# Patient Record
Sex: Female | Born: 1970 | Race: White | Hispanic: No | Marital: Married | Smoking: Former smoker
Health system: Southern US, Community
[De-identification: ages and names within clinical notes are randomized; demographics above are authoritative.]

## PROBLEM LIST (undated history)

## (undated) DIAGNOSIS — F419 Anxiety disorder, unspecified: Secondary | ICD-10-CM

## (undated) DIAGNOSIS — G43909 Migraine, unspecified, not intractable, without status migrainosus: Secondary | ICD-10-CM

## (undated) DIAGNOSIS — T7840XA Allergy, unspecified, initial encounter: Secondary | ICD-10-CM

## (undated) DIAGNOSIS — F32A Depression, unspecified: Secondary | ICD-10-CM

## (undated) HISTORY — PX: TUBAL LIGATION: SHX77

## (undated) HISTORY — DX: Allergy, unspecified, initial encounter: T78.40XA

## (undated) HISTORY — DX: Depression, unspecified: F32.A

## (undated) HISTORY — DX: Anxiety disorder, unspecified: F41.9

## (undated) HISTORY — PX: TOE SURGERY: SHX1073

## (undated) HISTORY — DX: Migraine, unspecified, not intractable, without status migrainosus: G43.909

---

## 2011-02-16 HISTORY — PX: ABLATION ON ENDOMETRIOSIS: SHX5787

## 2015-02-16 HISTORY — PX: CARDIAC ELECTROPHYSIOLOGY MAPPING AND ABLATION: SHX1292

## 2021-05-04 ENCOUNTER — Encounter: Payer: Self-pay | Admitting: Family Medicine

## 2021-05-04 ENCOUNTER — Ambulatory Visit: Payer: No Typology Code available for payment source | Admitting: Family Medicine

## 2021-05-04 VITALS — BP 118/80 | HR 81 | Temp 98.0°F | Resp 14 | Ht 63.0 in | Wt 135.0 lb

## 2021-05-04 DIAGNOSIS — F419 Anxiety disorder, unspecified: Secondary | ICD-10-CM

## 2021-05-04 DIAGNOSIS — Z1231 Encounter for screening mammogram for malignant neoplasm of breast: Secondary | ICD-10-CM

## 2021-05-04 DIAGNOSIS — I471 Supraventricular tachycardia, unspecified: Secondary | ICD-10-CM

## 2021-05-04 DIAGNOSIS — G43109 Migraine with aura, not intractable, without status migrainosus: Secondary | ICD-10-CM | POA: Diagnosis not present

## 2021-05-04 MED ORDER — CLONAZEPAM 0.5 MG PO TABS: 0.5000 mg | ORAL_TABLET | Freq: Two times a day (BID) | ORAL | 5 refills | Status: DC

## 2021-05-04 NOTE — Progress Notes (Signed)
? ?New Patient Office Visit ? ?Subjective:  ?Patient ID: Sierra Mayo, female    DOB: 1970-06-12  Age: 51 y.o. MRN: [id number] ? ?CC:  ?Chief Complaint  ?Patient presents with  ? Establish Care  ? ? ?HPI-here w/husb ?Candace Cruise presents for new pt.  Moved from [place] ? Anxiety-taking klonopin daily and occ BID-2-3x/wk.  Has tried mult SSRI's-bad SE and more depressed.  A lot of life events-moved here, children didn't.  Worries.  Pt walks for exercise. No SI.  In past, told Bipolar and on mult meds and then saw another psych and told not BiPolar.  Does a lot of walking.  Has tried BuSpar. ?H/o SVT-had ablation-occ cramping in chest. ?Migraine w/and w/o aura-imitrex works well-not taking meds for 51mo ?Vertigo-once in 18 mo.  Meclizine if needed ? ?Past Medical History:  ?Diagnosis Date  ? Allergy   ? Anxiety 1994  ? Depression 1994  ? Migraine   ? w/and w/o  ? ? ?Past Surgical History:  ?Procedure Laterality Date  ? ABLATION ON ENDOMETRIOSIS N/A 2013  ? uterine  ? CARDIAC ELECTROPHYSIOLOGY MAPPING AND ABLATION N/A 2017  ? SVT  ? TOE SURGERY Right   ? 3rd toe x2  ? TUBAL LIGATION  2006  ? ? ?Family History  ?Problem Relation Age of Onset  ? Anxiety disorder Mother   ? Depression Mother   ? Diabetes Father   ? Heart disease Maternal Grandfather   ? Depression Maternal Grandmother   ? Heart disease Paternal Grandmother   ? Early death Paternal Uncle   ? Learning disabilities Sister   ? Learning disabilities Son   ? ? ?Social History  ? ?Socioeconomic History  ? Marital status: Married  ?  Spouse name: Not on file  ? Number of children: 2  ? Years of education: Not on file  ? Highest education level: Not on file  ?Occupational History  ? Not on file  ?Tobacco Use  ? Smoking status: Former  ?  Packs/day: 1.00  ?  Years: 15.00  ?  Pack years: 15.00  ?  Types: Cigarettes  ?  Quit date: 10/28/2001  ?  Years since quitting: 19.5  ? Smokeless tobacco: Never  ?Vaping Use  ? Vaping Use: Never used  ?Substance and Sexual Activity  ?  Alcohol use: Yes  ?  Alcohol/week: 4.0 standard drinks  ?  Types: 4 Standard drinks or equivalent per week  ?  Comment: Seltzer  ? Drug use: Never  ? Sexual activity: Yes  ?  Birth control/protection: Other-see comments  ?  Comment: Perimenopausal  ?Other Topics Concern  ? Not on file  ?Social History Narrative  ? 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 ?Social Determinants of Health  ? ?Financial Resource Strain: Not on file  ?Food Insecurity: Not on file  ?Transportation Needs: Not on file  ?Physical Activity: Not on file  ?Stress: Not on file  ?Social Connections: Not on file  ?Intimate Partner Violence: Not on file  ? ? ?ROS  ?ROS: ?Gen: no fever, chills  ?Skin: no rash, itching ?ENT: no ear pain, ear drainage, nasal congestion, rhinorrhea, sinus pressure, sore throat ?Eyes: no blurry vision, double vision ?Resp: no cough, wheeze,SOB ?Breast: no breast tenderness, no nipple discharge, no breast masses ?CV: no CP, palpitations, LE edema,  ?GI: no heartburn, n/v/d/c, abd pain ?GU: no dysuria, urgency, frequency, hematuria.  LMP 70mo ago ?MSK: no joint pain, myalgias, back pain ?Neuro: occ dizziness,  occ headache, ?  Psych: HPI  ? ?Objective:  ? ?Today's Vitals: BP 118/80   Pulse 81   Temp 98 ?F (36.7 ?C) (Temporal)   Resp (!) 98   Ht 5\' 3"  (1.6 m)   Wt 135 lb (61.2 kg)   BMI 23.91 kg/m?  ? ?Physical Exam  ?Gen: WDWN NAD WF ?HEENT: NCAT, conjunctiva not injected, sclera nonicteric ?TM WNL B, OP moist, no exudates  ?NECK:  supple, no thyromegaly, no nodes, no carotid bruits ?CARDIAC: RRR, S1S2+, no murmur. DP 2+B ?LUNGS: CTAB. No wheezes ?ABDOMEN:  BS+, soft, NTND, No HSM, no masses ?EXT:  no edema ?MSK: no gross abnormalities.  ?NEURO: A&O x3.  CN II-XII intact.  ?PSYCH: normal mood. Good eye contact  ? ?Assessment & Plan:  ? ?Problem List Items Addressed This Visit   ? ?  ? Cardiovascular and Mediastinum  ? Migraine with aura and without status migrainosus, not intractable  ? Relevant  Medications  ? SUMAtriptan (IMITREX) 50 MG tablet  ? clonazePAM (KLONOPIN) 0.5 MG tablet  ? SVT (supraventricular tachycardia) (HCC)  ?  ? Other  ? Anxiety - Primary  ? ?Other Visit Diagnoses   ? ? Encounter for screening mammogram for malignant neoplasm of breast      ? Relevant Orders  ? MM Digital Screening  ? ?  ?Pdmp checked ? Anxiety-intol to mult meds.  Klonoprin works.  Takes daily and bid few x/wk.  Cont.  ?Migraine w/and w/o aura.  Doing well currently.  Imitrex prn ?H/o SVT-s/p ablation-stable.  No complaints ?Due mamm-sch. ? ?F/u 6 mo ? ?Outpatient Encounter Medications as of 05/04/2021  ?Medication Sig  ? Black Cohosh Extract 80 MG CAPS Take by mouth.  ? ELDERBERRY PO Take by mouth.  ? Folic Acid-Cholecalciferol 05/06/2021 MG-UNIT TABS   ? Ginkgo Biloba 120 MG TABS   ? meclizine (ANTIVERT) 25 MG tablet Take one tab every 8hrs as needed for vertigo  ? Multiple Vitamins-Minerals (YOUR LIFE MULTI ADULT GUMMIES) CHEW   ? SUMAtriptan (IMITREX) 50 MG tablet TAKE ONE TABLET BY MOUTH AT ONSET OF HEADACHE, MAY REPEAT IN 2 HOURS IF UNRESOLVED - DO NOT EXCEED 200MG  IN 24 HOURS  ? TURMERIC PO   ? vitamin E 45 MG (100 UNITS) capsule Take by mouth.  ? [DISCONTINUED] clonazePAM (KLONOPIN) 0.5 MG tablet Take by mouth.  ? clonazePAM (KLONOPIN) 0.5 MG tablet Take 1 tablet (0.5 mg total) by mouth 2 (two) times daily.  ? ?No facility-administered encounter medications on file as of 05/04/2021.  ? ? ?Follow-up: Return in about 6 months (around 11/04/2021) for anxiety.  ? ?05/06/2021, MD ?

## 2021-05-04 NOTE — Patient Instructions (Addendum)
Welcome to [facility]! It was a pleasure meeting you today. ? ?As discussed, Please schedule a 6 month follow up visit today. ? [phone] for mammogram  ? ?PLEASE NOTE: ? ?If you had any LAB tests please let us know if you have not heard back within a few days. You may see your results on MyChart before we have a chance to review them but we will give you a call once they are reviewed by [place]. If we ordered any REFERRALS today, please let us know if you have not heard from their office within the next week.  ?Let us know through MyChart if you are needing REFILLS, or have your pharmacy send [place] the request. You can also use MyChart to communicate with me or any office staff. ? ?Please try these tips to maintain a healthy lifestyle: ? ?Eat most of your calories during the day when you are active. Eliminate processed foods including packaged sweets (pies, cakes, cookies), reduce intake of potatoes, white bread, white pasta, and white rice. Look for whole grain options, oat flour or almond flour. ? ?Each meal should contain half fruits/vegetables, one quarter protein, and one quarter carbs (no bigger than a computer mouse). ? ?Cut down on sweet beverages. This includes juice, soda, and sweet tea. Also watch fruit intake, though this is a healthier sweet option, it still contains natural sugar! Limit to 3 servings daily. ? ?Drink at least 1 glass of water with each meal and aim for at least 8 glasses per day ? ?Exercise at least 150 minutes every week.   ?

## 2021-06-05 ENCOUNTER — Ambulatory Visit
Admission: RE | Admit: 2021-06-05 | Discharge: 2021-06-05 | Disposition: A | Discharge status: Home / Self Care | Payer: No Typology Code available for payment source | Source: Ambulatory Visit | Attending: Family Medicine | Admitting: Family Medicine

## 2021-06-05 ENCOUNTER — Ambulatory Visit: Payer: No Typology Code available for payment source

## 2021-06-05 DIAGNOSIS — Z1231 Encounter for screening mammogram for malignant neoplasm of breast: Secondary | ICD-10-CM

## 2021-11-04 ENCOUNTER — Ambulatory Visit: Payer: No Typology Code available for payment source | Admitting: Family Medicine

## 2021-11-04 ENCOUNTER — Encounter: Payer: Self-pay | Admitting: Family Medicine

## 2021-11-04 VITALS — BP 110/70 | HR 67 | Temp 98.0°F | Ht 63.0 in | Wt 134.5 lb

## 2021-11-04 DIAGNOSIS — G43109 Migraine with aura, not intractable, without status migrainosus: Secondary | ICD-10-CM | POA: Diagnosis not present

## 2021-11-04 DIAGNOSIS — F419 Anxiety disorder, unspecified: Secondary | ICD-10-CM | POA: Diagnosis not present

## 2021-11-04 DIAGNOSIS — Z1211 Encounter for screening for malignant neoplasm of colon: Secondary | ICD-10-CM | POA: Diagnosis not present

## 2021-11-04 MED ORDER — SUMATRIPTAN SUCCINATE 50 MG PO TABS: ORAL_TABLET | ORAL | 1 refills | Status: DC

## 2021-11-04 MED ORDER — CLONAZEPAM 0.5 MG PO TABS: 0.5000 mg | ORAL_TABLET | Freq: Two times a day (BID) | ORAL | 5 refills | Status: DC

## 2021-11-04 NOTE — Progress Notes (Signed)
Subjective:     Patient ID: Sierra Mayo, female    DOB: [birth year], 51 y.o.   MRN: [id number]  Chief Complaint  Patient presents with   Follow-up    6 month follow-up [place] last night, pain on left side     HPI  Anxiety-taking Klonopin bid some days, usu once.  No SI.  Occ sadness.  Saw grief counsellor. Struggling w/finding groups of friends. Doing Yoga 3x/wk.walks daily.  Migraine-using imitrex-rarely uses. Needs refill. Mold related as well.   Pain L side-fell off deck steps-not attached so when hit last step, collapsed.  Whole L side pain. Last pm.  Pap Jan 2023 in [place].  Going to [employer] [place].   Cscope cologard .  Health Maintenance Due  Topic Date Due   HIV Screening  Never done   Hepatitis C Screening  Never done   COLONOSCOPY (Pts 45-26yrs Insurance coverage will need to be confirmed)  Never done    Past Medical History:  Diagnosis Date   Allergy    Anxiety 1994   Depression 1994   Migraine    w/and w/o    Past Surgical History:  Procedure Laterality Date   ABLATION ON ENDOMETRIOSIS N/A 2013   uterine   CARDIAC ELECTROPHYSIOLOGY MAPPING AND ABLATION N/A 2017   SVT   TOE SURGERY Right    3rd toe x2   TUBAL LIGATION  2006    Outpatient Medications Prior to Visit  Medication Sig Dispense Refill   Black Cohosh Extract 80 MG CAPS Take by mouth.     clonazePAM (KLONOPIN) 0.5 MG tablet Take 1 tablet (0.5 mg total) by mouth 2 (two) times daily. 60 tablet 5   ELDERBERRY PO Take by mouth.     Folic Acid-Cholecalciferol 02-4998 MG-UNIT TABS      Ginkgo Biloba 120 MG TABS      meclizine (ANTIVERT) 25 MG tablet Take one tab every 8hrs as needed for vertigo     Multiple Vitamins-Minerals (YOUR LIFE MULTI ADULT GUMMIES) CHEW      Omega-3 Fatty Acids (FISH OIL) 1000 MG CAPS Take 1,000 mg by mouth daily in the afternoon.     SUMAtriptan (IMITREX) 50 MG tablet TAKE ONE TABLET BY MOUTH AT ONSET OF HEADACHE, MAY REPEAT IN 2 HOURS IF UNRESOLVED - DO NOT EXCEED  200MG  IN 24 HOURS     TURMERIC PO      vitamin E 45 MG (100 UNITS) capsule Take by mouth.     No facility-administered medications prior to visit.    Allergies  Allergen Reactions   Prochlorperazine Anaphylaxis and Palpitations    Other reaction(s): GI Intolerance, GI upset, Other (See Comments) Other reaction(s): spasms dystonia    Amitriptyline     Other reaction(s): AMITRIPTYLINE (extreme fatigue after one dose)   Amitriptyline Hcl     Other reaction(s): AMITRIPTYLINE (extreme fatigue after one dose)   Erythromycin Diarrhea    Other reaction(s): GI Intolerance, GI upset   Propranolol     Other reaction(s): Dizzy / Lightheaded Other reaction(s): Dizzy / Lightheaded    ROS neg/noncontributory except as noted HPI/below      Objective:     BP 110/70   Pulse 67   Temp 98 F (36.7 C) (Temporal)   Ht 5\' 3"  (1.6 m)   Wt 134 lb 8 oz (61 kg)   SpO2 98%   BMI 23.83 kg/m  Wt Readings from Last 3 Encounters:  11/04/21 134 lb 8 oz (61 kg)  05/04/21 135 lb (61.2 kg)    Physical Exam   Gen: WDWN NAD HEENT: NCAT, conjunctiva not injected, sclera nonicteric NECK:  supple, no thyromegaly, no nodes, no carotid bruits CARDIAC: RRR, S1S2+, no murmur. DP 2+B LUNGS: CTAB. No wheezes ABDOMEN:  BS+, soft, NTND, No HSM, no masses EXT:  no edema MSK: no gross abnormalities. Some TTP L hip area, tight muscles L back.   NEURO: A&O x3.  CN II-XII intact.  PSYCH: normal mood. Good eye contact     Assessment & Plan:   Problem List Items Addressed This Visit       Cardiovascular and Mediastinum   Migraine with aura and without status migrainosus, not intractable     Other   Anxiety - Primary   Other Visit Diagnoses     Screen for colon cancer          Migraine-chronic.  Stable.  Gets <1/mo.  Renew sumatriptan 50mg .  Anxiety-chronic.  Well controlled on BID clonazepam 0.5mg .  pdmp checked and appropriate.  Renewed.  F/u 23mo Screen colon ca-ordered cologard.   S/p  fall-contusions-aleve 2 bid.   No orders of the defined types were placed in this encounter.   5mo, MD

## 2021-11-04 NOTE — Patient Instructions (Signed)
It was very nice to see you today!  Take 2 aleve twice/day and tylenol if needed.  Ice, icey hot, biofreeze.    PLEASE NOTE:  If you had any lab tests please let us know if you have not heard back within a few days. You may see your results on MyChart before we have a chance to review them but we will give you a call once they are reviewed by [place]. If we ordered any referrals today, please let us know if you have not heard from their office within the next week.   Please try these tips to maintain a healthy lifestyle:  Eat most of your calories during the day when you are active. Eliminate processed foods including packaged sweets (pies, cakes, cookies), reduce intake of potatoes, white bread, white pasta, and white rice. Look for whole grain options, oat flour or almond flour.  Each meal should contain half fruits/vegetables, one quarter protein, and one quarter carbs (no bigger than a computer mouse).  Cut down on sweet beverages. This includes juice, soda, and sweet tea. Also watch fruit intake, though this is a healthier sweet option, it still contains natural sugar! Limit to 3 servings daily.  Drink at least 1 glass of water with each meal and aim for at least 8 glasses per day  Exercise at least 150 minutes every week.

## 2021-12-14 LAB — COLOGUARD: Cologuard: NEGATIVE

## 2021-12-19 LAB — COLOGUARD: COLOGUARD: NEGATIVE

## 2021-12-21 ENCOUNTER — Encounter: Payer: Self-pay | Admitting: Family Medicine

## 2022-01-26 ENCOUNTER — Encounter: Payer: Self-pay | Admitting: Family Medicine

## 2022-01-26 ENCOUNTER — Ambulatory Visit: Payer: No Typology Code available for payment source | Admitting: Family Medicine

## 2022-01-26 VITALS — BP 100/80 | HR 64 | Temp 98.2°F | Ht 63.0 in | Wt 142.0 lb

## 2022-01-26 DIAGNOSIS — M79674 Pain in right toe(s): Secondary | ICD-10-CM | POA: Diagnosis not present

## 2022-01-26 NOTE — Progress Notes (Signed)
Subjective:     Patient ID: Sierra Mayo, female    DOB: 09-02-1970, 51 y.o.   MRN: [id number]  Chief Complaint  Patient presents with   Referral    Discuss referral to Podiatry     HPI Refer pod-had surgery for r 3rd toe-dislocated-2016.  Then hammar/nerve bundle and infection.  So then pinned and now doesn't bend.  Walks a lot(3-83miles)-pain bad that toe.   Hard to wear some shoes.  Getting worse.   Health Maintenance Due  Topic Date Due   HIV Screening  Never done   Hepatitis C Screening  Never done    Past Medical History:  Diagnosis Date   Allergy    Anxiety 1994   Depression 1994   Migraine    w/and w/o    Past Surgical History:  Procedure Laterality Date   ABLATION ON ENDOMETRIOSIS N/A 2013   uterine   CARDIAC ELECTROPHYSIOLOGY MAPPING AND ABLATION N/A 2017   SVT   TOE SURGERY Right    3rd toe x2   TUBAL LIGATION  2006    Outpatient Medications Prior to Visit  Medication Sig Dispense Refill   Black Cohosh Extract 80 MG CAPS Take by mouth.     clonazePAM (KLONOPIN) 0.5 MG tablet Take 1 tablet (0.5 mg total) by mouth 2 (two) times daily. 60 tablet 5   ELDERBERRY PO Take by mouth.     Ginkgo Biloba 120 MG TABS      meclizine (ANTIVERT) 25 MG tablet Take one tab every 8hrs as needed for vertigo     Multiple Vitamins-Minerals (YOUR LIFE MULTI ADULT GUMMIES) CHEW      Omega-3 Fatty Acids (FISH OIL) 1000 MG CAPS Take 1,000 mg by mouth daily in the afternoon.     SUMAtriptan (IMITREX) 50 MG tablet TAKE ONE TABLET BY MOUTH AT ONSET OF HEADACHE, MAY REPEAT IN 2 HOURS IF UNRESOLVED - DO NOT EXCEED 200MG  IN 24 HOURS 9 tablet 1   TURMERIC PO      vitamin E 45 MG (100 UNITS) capsule Take by mouth.     Folic Acid-Cholecalciferol 02-4998 MG-UNIT TABS      No facility-administered medications prior to visit.    Allergies  Allergen Reactions   Prochlorperazine Anaphylaxis and Palpitations    Other reaction(s): GI Intolerance, GI upset, Other (See Comments) Other  reaction(s): spasms dystonia    Amitriptyline     Other reaction(s): AMITRIPTYLINE (extreme fatigue after one dose)   Amitriptyline Hcl     Other reaction(s): AMITRIPTYLINE (extreme fatigue after one dose)   Erythromycin Diarrhea    Other reaction(s): GI Intolerance, GI upset   Propranolol     Other reaction(s): Dizzy / Lightheaded Other reaction(s): Dizzy / Lightheaded    ROS neg/noncontributory except as noted HPI/below      Objective:     BP 100/80   Pulse 64   Temp 98.2 F (36.8 C) (Temporal)   Ht 5\' 3"  (1.6 m)   Wt 142 lb (64.4 kg)   SpO2 99%   BMI 25.15 kg/m  Wt Readings from Last 3 Encounters:  01/26/22 142 lb (64.4 kg)  11/04/21 134 lb 8 oz (61 kg)  05/04/21 135 lb (61.2 kg)    Physical Exam   Gen: WDWN NAD HEENT: NCAT, conjunctiva not injected, sclera nonicteric EXT:  no edema MSK: no gross abnormalities.  NEURO: A&O x3.  CN II-XII intact.  PSYCH: normal mood. Good eye contact R third toe-scars.  Assessment & Plan:   Problem List Items Addressed This Visit   None Visit Diagnoses     Pain of toe of right foot    -  Primary   Relevant Orders   Ambulatory referral to Podiatry      Pain R third toe-surgery x 2 in past.  Inhibiting activity.   Refer pod.   No orders of the defined types were placed in this encounter.   Angelena Sole, MD

## 2022-01-26 NOTE — Patient Instructions (Addendum)
Referral sent to Dr. Ovid Curd.  Happy Holidays!

## 2022-02-04 ENCOUNTER — Ambulatory Visit: Payer: No Typology Code available for payment source | Admitting: Podiatry

## 2022-02-04 ENCOUNTER — Ambulatory Visit (INDEPENDENT_AMBULATORY_CARE_PROVIDER_SITE_OTHER): Payer: No Typology Code available for payment source

## 2022-02-04 DIAGNOSIS — M19071 Primary osteoarthritis, right ankle and foot: Secondary | ICD-10-CM

## 2022-02-04 DIAGNOSIS — M2041 Other hammer toe(s) (acquired), right foot: Secondary | ICD-10-CM

## 2022-02-04 DIAGNOSIS — M21611 Bunion of right foot: Secondary | ICD-10-CM | POA: Diagnosis not present

## 2022-02-04 NOTE — Progress Notes (Signed)
Subjective:   Patient ID: Sierra Mayo, female   DOB: 51 y.o.   MRN: [id number]   HPI Chief Complaint  Patient presents with   Toe Pain    Rm 13 Right 3rd toe pain. Pt states she had surgery in 2016 in the same toe.     51 year old female presents the office with above concerns.  She has had 2 surgeries. At first it was for a "discoloration". They went in form the bottom to fix it then after developed a hammertoe and she had another surgery.  Now she continued pain to the toe as well as along the MPJ that she points to.  No recent injuries that she reports.   Review of Systems  All other systems reviewed and are negative.  Past Medical History:  Diagnosis Date   Allergy    Anxiety 1994   Depression 1994   Migraine    w/and w/o    Past Surgical History:  Procedure Laterality Date   ABLATION ON ENDOMETRIOSIS N/A 2013   uterine   CARDIAC ELECTROPHYSIOLOGY MAPPING AND ABLATION N/A 2017   SVT   TOE SURGERY Right    3rd toe x2   TUBAL LIGATION  2006     Current Outpatient Medications:    Black Cohosh Extract 80 MG CAPS, Take by mouth., Disp: , Rfl:    clonazePAM (KLONOPIN) 0.5 MG tablet, Take 1 tablet (0.5 mg total) by mouth 2 (two) times daily., Disp: 60 tablet, Rfl: 5   ELDERBERRY PO, Take by mouth., Disp: , Rfl:    Ginkgo Biloba 120 MG TABS, , Disp: , Rfl:    meclizine (ANTIVERT) 25 MG tablet, Take one tab every 8hrs as needed for vertigo, Disp: , Rfl:    Multiple Vitamins-Minerals (YOUR LIFE MULTI ADULT GUMMIES) CHEW, , Disp: , Rfl:    Omega-3 Fatty Acids (FISH OIL) 1000 MG CAPS, Take 1,000 mg by mouth daily in the afternoon., Disp: , Rfl:    SUMAtriptan (IMITREX) 50 MG tablet, TAKE ONE TABLET BY MOUTH AT ONSET OF HEADACHE, MAY REPEAT IN 2 HOURS IF UNRESOLVED - DO NOT EXCEED 200MG  IN 24 HOURS, Disp: 9 tablet, Rfl: 1   TURMERIC PO, , Disp: , Rfl:    vitamin E 45 MG (100 UNITS) capsule, Take by mouth., Disp: , Rfl:   Allergies  Allergen Reactions   Prochlorperazine  Anaphylaxis and Palpitations    Other reaction(s): GI Intolerance, GI upset, Other (See Comments) Other reaction(s): spasms dystonia    Amitriptyline     Other reaction(s): AMITRIPTYLINE (extreme fatigue after one dose)   Amitriptyline Hcl     Other reaction(s): AMITRIPTYLINE (extreme fatigue after one dose)   Erythromycin Diarrhea    Other reaction(s): GI Intolerance, GI upset   Propranolol     Other reaction(s): Dizzy / Lightheaded Other reaction(s): Dizzy / Lightheaded           Objective:  Physical Exam  General: AAO x3, NAD  Dermatological: Skin is warm, dry and supple bilateral. Nails x 10 are well manicured; remaining integument appears unremarkable at this time. There are no open sores, no preulcerative lesions, no rash or signs of infection present.  Vascular: Dorsalis Pedis artery and Posterior Tibial artery pedal pulses are 2/4 bilateral with immedate capillary fill time. There is no pain with calf compression, swelling, warmth, erythema.   Neruologic: Grossly intact via light touch bilateral.   Musculoskeletal: Bunions present on the right foot.  There is tenderness along the third toe and  there is a hammertoe present of the third digit.  There is mild discomfort with MPJ range of motion.  There is trace edema.  No erythema or warmth.  Gait: Unassisted, Nonantalgic.       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  Moderate bunions present with a moderate increase in first second intermetatarsal angle.  Arthritis present at the third MPJ.  Digital deformities also noted. -We discussed both conservative as well as surgical treatment options.  I think at this point she is likely need surgery tomorrow to fix this.  However prior to this  time we will order an MRI to further evaluate the integrity of the MPJ as well as the plantar plate.  She is also getting pain on the bunion.  I think this would also be helpful to fix the bunion to get the big toe over so is not rubbing and putting pressure on the lesser digits.  Will await the results of the MRI before proceeding with surgery.  Vivi Barrack DPM

## 2022-02-17 ENCOUNTER — Telehealth: Payer: Self-pay | Admitting: Podiatry

## 2022-02-17 NOTE — Telephone Encounter (Signed)
Pt was seen on 12.21.2023 and was to have a mri ordered and she has not heard anything, I see the order but it says pending. Could we check status and let pt know please.

## 2022-02-18 ENCOUNTER — Other Ambulatory Visit: Payer: Self-pay | Admitting: Podiatry

## 2022-02-18 ENCOUNTER — Other Ambulatory Visit: Payer: Self-pay

## 2022-02-18 NOTE — Progress Notes (Signed)
error 

## 2022-02-26 ENCOUNTER — Other Ambulatory Visit: Payer: No Typology Code available for payment source

## 2022-03-06 ENCOUNTER — Ambulatory Visit
Admission: RE | Admit: 2022-03-06 | Discharge: 2022-03-06 | Disposition: A | Discharge status: Home / Self Care | Payer: No Typology Code available for payment source | Source: Ambulatory Visit | Attending: Podiatry | Admitting: Podiatry

## 2022-03-06 DIAGNOSIS — M19071 Primary osteoarthritis, right ankle and foot: Secondary | ICD-10-CM

## 2022-03-10 ENCOUNTER — Other Ambulatory Visit: Payer: Self-pay | Admitting: Family Medicine

## 2022-03-16 ENCOUNTER — Ambulatory Visit (INDEPENDENT_AMBULATORY_CARE_PROVIDER_SITE_OTHER): Payer: No Typology Code available for payment source | Admitting: Family Medicine

## 2022-03-16 ENCOUNTER — Encounter: Payer: Self-pay | Admitting: Family Medicine

## 2022-03-16 VITALS — BP 113/73 | HR 79 | Temp 98.0°F | Ht 63.0 in | Wt 144.8 lb

## 2022-03-16 DIAGNOSIS — R635 Abnormal weight gain: Secondary | ICD-10-CM | POA: Diagnosis not present

## 2022-03-16 DIAGNOSIS — Z1159 Encounter for screening for other viral diseases: Secondary | ICD-10-CM

## 2022-03-16 DIAGNOSIS — R102 Pelvic and perineal pain: Secondary | ICD-10-CM | POA: Diagnosis not present

## 2022-03-16 DIAGNOSIS — R1084 Generalized abdominal pain: Secondary | ICD-10-CM

## 2022-03-16 LAB — POCT URINALYSIS DIPSTICK
Bilirubin, UA: NEGATIVE
Blood, UA: NEGATIVE
Glucose, UA: NEGATIVE
Ketones, UA: NEGATIVE
Nitrite, UA: NEGATIVE
Protein, UA: NEGATIVE
Spec Grav, UA: <=1.005 — AB (ref 1.010–1.025)
Urobilinogen, UA: 0.2 E.U./dL
pH, UA: 6.5 (ref 5.0–8.0)

## 2022-03-16 NOTE — Progress Notes (Signed)
Subjective:     Patient ID: Sierra Mayo, female    DOB: 15-Mar-1970, 52 y.o.   MRN: [id number]  Chief Complaint  Patient presents with   discuss pelvic floor therapy    Pt states she gained 8 lbs in 3 weeks    HPI  Wants to discuss pelvic floor therapy-had appt at [facility]-which is cash pay.  Sees gyn in March. Pelvic pain-and LBP.  +dysparunia-long time but worse.  Gaining wt around middle.  "Fluid" when goes to sit down.  Stress incont w/sneezing.  Not during yoga or walking.  Started past 6 wks or so.  2.  Wt gain-8# in 3 wks.  Exercises-walks 3-41miles/day.  Yoga 6x/wk.  Menopause/perimenopause for 4 yrs. 3.  Foot-saw pod.  MRI done  Health Maintenance Due  Topic Date Due   Hepatitis C Screening  Never done   COVID-19 Vaccine (6 - 2023-24 season) 02/08/2022    Past Medical History:  Diagnosis Date   Allergy    Anxiety 1994   Depression 1994   Migraine    w/and w/o    Past Surgical History:  Procedure Laterality Date   ABLATION ON ENDOMETRIOSIS N/A 2013   uterine   CARDIAC ELECTROPHYSIOLOGY MAPPING AND ABLATION N/A 2017   SVT   TOE SURGERY Right    3rd toe x2   TUBAL LIGATION  2006    Outpatient Medications Prior to Visit  Medication Sig Dispense Refill   clonazePAM (KLONOPIN) 0.5 MG tablet Take 1 tablet (0.5 mg total) by mouth 2 (two) times daily. 60 tablet 5   ELDERBERRY PO Take by mouth.     Ginkgo Biloba 120 MG TABS      meclizine (ANTIVERT) 25 MG tablet Take one tab every 8hrs as needed for vertigo     Multiple Vitamins-Minerals (YOUR LIFE MULTI ADULT GUMMIES) CHEW      Omega-3 Fatty Acids (FISH OIL) 1000 MG CAPS Take 1,000 mg by mouth daily in the afternoon.     SUMAtriptan (IMITREX) 50 MG tablet TAKE 1 TABLET BY MOUTH AT ONSET OF HEADACHE. MAY REPEAT IN 2 HOURS IF UNRESOLVED - DO NOT EXCEED 200 MG IN 24 HOURS 9 tablet 1   TURMERIC PO      vitamin E 45 MG (100 UNITS) capsule Take by mouth.     Black Cohosh Extract 80 MG CAPS Take by mouth.     No  facility-administered medications prior to visit.    Allergies  Allergen Reactions   Prochlorperazine Anaphylaxis and Palpitations    Other reaction(s): GI Intolerance, GI upset, Other (See Comments) Other reaction(s): spasms dystonia    Amitriptyline     Other reaction(s): AMITRIPTYLINE (extreme fatigue after one dose)   Amitriptyline Hcl     Other reaction(s): AMITRIPTYLINE (extreme fatigue after one dose)   Erythromycin Diarrhea    Other reaction(s): GI Intolerance, GI upset   Propranolol     Other reaction(s): Dizzy / Lightheaded Other reaction(s): Dizzy / Lightheaded    ROS neg/noncontributory except as noted HPI/below      Objective:     BP 113/73 (BP Location: Right Arm, Patient Position: Sitting)   Pulse 79   Temp 98 F (36.7 C) (Temporal)   Ht 5\' 3"  (1.6 m)   Wt 144 lb 12.8 oz (65.7 kg)   SpO2 98%   BMI 25.65 kg/m  Wt Readings from Last 3 Encounters:  03/16/22 144 lb 12.8 oz (65.7 kg)  01/26/22 142 lb (64.4 kg)  11/04/21  134 lb 8 oz (61 kg)    Physical Exam   Gen: WDWN NAD HEENT: NCAT, conjunctiva not injected, sclera nonicteric NECK:  supple, no thyromegaly, no nodes, no carotid bruits CARDIAC: RRR, S1S2+, no murmur. DP 2+B LUNGS: CTAB. No wheezes ABDOMEN:  BS+, soft,tender mild diffusely.  Fullness suprapubically, No HSM, no masses.  No cvat EXT:  no edema MSK: no gross abnormalities.  NEURO: A&O x3.  CN II-XII intact.  PSYCH: normal mood. Good eye contact  Results for orders placed or performed in visit on 03/16/22  POCT urinalysis dipstick  Result Value Ref Range   Color, UA clear    Clarity, UA cloudy    Glucose, UA Negative Negative   Bilirubin, UA neg    Ketones, UA neg    Spec Grav, UA <=1.005 (A) 1.010 - 1.025   Blood, UA neg    pH, UA 6.5 5.0 - 8.0   Protein, UA Negative Negative   Urobilinogen, UA 0.2 0.2 or 1.0 E.U./dL   Nitrite, UA neg    Leukocytes, UA Trace (A) Negative   Appearance     Odor          Assessment &  Plan:   Problem List Items Addressed This Visit   None Visit Diagnoses     Generalized abdominal pain    -  Primary   Relevant Orders   Comprehensive metabolic panel   CBC with Differential/Platelet   TSH   POCT urinalysis dipstick   US Abdomen Complete   US Pelvis Complete   Pelvic pain       Relevant Orders   US Pelvis Complete   Weight gain       Relevant Orders   TSH   Encounter for hepatitis C screening test for low risk patient       Relevant Orders   Hepatitis C antibody      Abd pain-general and fullness suprapubic-?mass, uterus, other. Check u/s ab/pelvis.  Check cbc,cmp, ua.   Pelvic pain/fullness-check [place], u/s pelvis.  F/u gyn Wt gain-check TSH, also fullness lower abd.   No orders of the defined types were placed in this encounter.   Wellington Hampshire, MD

## 2022-03-16 NOTE — Patient Instructions (Signed)
[facility]- [phone] for mammogram, other studies  

## 2022-03-17 LAB — COMPREHENSIVE METABOLIC PANEL
ALT: 19 U/L (ref 0–35)
AST: 20 U/L (ref 0–37)
Albumin: 4.6 g/dL (ref 3.5–5.2)
Alkaline Phosphatase: 65 U/L (ref 39–117)
BUN: 12 mg/dL (ref 6–23)
CO2: 31 mEq/L (ref 19–32)
Calcium: 9.7 mg/dL (ref 8.4–10.5)
Chloride: 103 mEq/L (ref 96–112)
Creatinine, Ser: 0.76 mg/dL (ref 0.40–1.20)
GFR: 89.14 mL/min (ref >60.00)
Glucose, Bld: 89 mg/dL (ref 70–99)
Potassium: 4.5 mEq/L (ref 3.5–5.1)
Sodium: 142 mEq/L (ref 135–145)
Total Bilirubin: 0.4 mg/dL (ref 0.2–1.2)
Total Protein: 6.4 g/dL (ref 6.0–8.3)

## 2022-03-17 LAB — URINE CULTURE
MICRO NUMBER:: 14493126
Result:: NO GROWTH
SPECIMEN QUALITY:: ADEQUATE

## 2022-03-17 LAB — CBC WITH DIFFERENTIAL/PLATELET
Basophils Absolute: 0.1 10*3/uL (ref 0.0–0.1)
Basophils Relative: 1.2 % (ref 0.0–3.0)
Eosinophils Absolute: 0.2 10*3/uL (ref 0.0–0.7)
Eosinophils Relative: 2.5 % (ref 0.0–5.0)
HCT: 39 % (ref 36.0–46.0)
Hemoglobin: 13.2 g/dL (ref 12.0–15.0)
Lymphocytes Relative: 30.9 % (ref 12.0–46.0)
Lymphs Abs: 1.9 10*3/uL (ref 0.7–4.0)
MCHC: 33.9 g/dL (ref 30.0–36.0)
MCV: 89.2 fl (ref 78.0–100.0)
Monocytes Absolute: 0.5 10*3/uL (ref 0.1–1.0)
Monocytes Relative: 8.7 % (ref 3.0–12.0)
Neutro Abs: 3.6 10*3/uL (ref 1.4–7.7)
Neutrophils Relative %: 56.7 % (ref 43.0–77.0)
Platelets: 293 10*3/uL (ref 150.0–400.0)
RBC: 4.38 Mil/uL (ref 3.87–5.11)
RDW: 13 % (ref 11.5–15.5)
WBC: 6.3 10*3/uL (ref 4.0–10.5)

## 2022-03-17 LAB — TSH: TSH: 1.48 u[IU]/mL (ref 0.35–5.50)

## 2022-03-17 LAB — HEPATITIS C ANTIBODY: Hepatitis C Ab: NONREACTIVE

## 2022-03-22 ENCOUNTER — Ambulatory Visit: Payer: No Typology Code available for payment source | Admitting: Podiatry

## 2022-03-22 DIAGNOSIS — M19071 Primary osteoarthritis, right ankle and foot: Secondary | ICD-10-CM | POA: Diagnosis not present

## 2022-03-22 DIAGNOSIS — M2041 Other hammer toe(s) (acquired), right foot: Secondary | ICD-10-CM

## 2022-03-22 DIAGNOSIS — M21611 Bunion of right foot: Secondary | ICD-10-CM | POA: Diagnosis not present

## 2022-03-22 NOTE — Progress Notes (Signed)
Subjective: Chief Complaint  Patient presents with   Sierra Mayo    mri results/ surgical planning   52 year old female presents the office today with above concerns.  She states that due to ongoing pain she was discussed different treatment options possibly surgery.  She also presents today to further discuss the MRI.  Objective: AAO x3, NAD DP/PT pulses palpable bilaterally, CRT less than 3 seconds Bunion is present on the right foot and there is tenderness palpation along the second and third MPJs.  Rigid contracture noted to the right third toe.  There does appear to be with the area of scar tissue over where the skin was sutured from the third toe to the proximal area which is holding the toe down as well. No pain with calf compression, swelling, warmth, erythema  Assessment: 52 year old female with metatarsalgia/arthritis, distal deformity right third toe, bunion  Plan: -All treatment options discussed with the patient including all alternatives, risks, complications.  -I reviewed the MRI with her as well as her husband.  We discussed with conservative as well as surgical treatment options.  Offered steroid injection, orthotics.  Discussed shoe modifications.  Discussion was to proceed with surgery.  Discussed also, Aiken bunionectomy with Weil of the second and third, revision third digit hammertoe as well as release of scar tissue from the plantar aspect of the left third toe. -The incision placement as well as the postoperative course was discussed with the patient. I discussed risks of the surgery which include, but not limited to, infection, bleeding, pain, swelling, need for further surgery, delayed or nonhealing, painful or ugly scar, numbness or sensation changes, over/under correction, recurrence, transfer lesions, further deformity, hardware failure, DVT/PE, loss of toe/foot. Patient understands these risks and wishes to proceed with surgery. The surgical consent was reviewed with  the patient all 3 pages were signed. No promises or guarantees were given to the outcome of the procedure. All questions were answered to the best of my ability. Before the surgery the patient was encouraged to call the office if there is any further questions. The surgery will be performed at the [facility] on an outpatient basis. -Patient encouraged to call the office with any questions, concerns, change in symptoms.   [name]

## 2022-03-22 NOTE — Patient Instructions (Signed)
Pre-Operative Instructions  Congratulations, you have decided to take an important step to improving your quality of life.  You can be assured that the doctors of [facility] will be with you every step of the way.  Plan to be at the surgery center/hospital at least 1 (one) hour prior to your scheduled time unless otherwise directed by the surgical center/hospital staff.  You must have a responsible adult accompany you, remain during the surgery and drive you home.  Make sure you have directions to the surgical center/hospital and know how to get there on time. For hospital based surgery you will need to obtain a history and physical form from your family physician within 1 month prior to the date of surgery- we will give you a form for you primary physician.  We make every effort to accommodate the date you request for surgery.  There are however, times where surgery dates or times have to be moved.  We will contact you as soon as possible if a change in schedule is required.   No Aspirin/Ibuprofen for one week before surgery.  If you are on aspirin, any non-steroidal anti-inflammatory medications (Mobic, Aleve, Ibuprofen) you should stop taking it 7 days prior to your surgery.  You make take Tylenol  For pain prior to surgery.  Medications- If you are taking daily heart and blood pressure medications, seizure, reflux, allergy, asthma, anxiety, pain or diabetes medications, make sure the surgery center/hospital is aware before the day of surgery so they may notify you which medications to take or avoid the day of surgery. No food or drink after midnight the night before surgery unless directed otherwise by surgical center/hospital staff. No alcoholic beverages 24 hours prior to surgery.  No smoking 24 hours prior to or 24 hours after surgery. Wear loose pants or shorts- loose enough to fit over bandages, boots, and casts. No slip on shoes, sneakers are best. Bring your boot with you to the  surgery center/hospital.  Also bring crutches or a walker if your physician has prescribed it for you.  If you do not have this equipment, it will be provided for you after surgery. If you have not been contracted by the surgery center/hospital by the day before your surgery, call to confirm the date and time of your surgery. Leave-time from work may vary depending on the type of surgery you have.  Appropriate arrangements should be made prior to surgery with your employer. Prescriptions will be provided immediately following surgery by your doctor.  Have these filled as soon as possible after surgery and take the medication as directed. Remove nail polish on the operative foot. Wash the night before surgery.  The night before surgery wash the foot and leg well with the antibacterial soap provided and water paying special attention to beneath the toenails and in between the toes.  Rinse thoroughly with water and dry well with a towel.  Perform this wash unless told not to do so by your physician.  Enclosed: 1 Ice pack (please put in freezer the night before surgery)   1 Hibiclens skin cleaner   Pre-op Instructions  If you have any questions regarding the instructions, do not hesitate to call our office at any point during this process.   [place]: [address] [place], [place] [address] [phone]  [place]: [address] [place], [place] [address] [phone]  Dr. Monee Dembeck, DPM  

## 2022-03-30 ENCOUNTER — Telehealth: Payer: Self-pay | Admitting: Urology

## 2022-03-30 NOTE — Telephone Encounter (Signed)
DOS - 04/28/22  DOUBLE OSTEOTOMY RIGHT --- 28299 METATARSAL OSTEOTOMY 2,3 RIGHT --- [id number] HAMMERTOE REPAIR 3RD RIGHT --- [id number] RELEASE SCAR TISSUE 3RD RIGHT --- 15004  [employer]ET[employer]   PER [employer]'S AUTOMATIVE SYSTEM FOR CPT CODES 09811, [id number], [id number] AND [id number] NO PRIOR AUTH IS REQUIRED.   REF # [id number]

## 2022-04-07 NOTE — Progress Notes (Deleted)
52 y.o. No obstetric history on file. Married {Race/ethnicity:17218} female here for annual exam.    PCP:     No LMP recorded. Patient is perimenopausal.           Sexually active: {yes no:314532}  The current method of family planning is perimenopausal.    Exercising: {yes no:314532}  {types:19826} Smoker:  former  Health Maintenance: Pap:  *** History of abnormal Pap:  {YES NO:22349} MMG:  06/05/21 Breast Density Category B, BI-RADS CATEGORY 1 Neg Colonoscopy:  *** BMD:   ***  Result  *** TDaP:  11/20/18 Gardasil:   {YES IB:[id number] HIV: Hep C: Screening Labs:  Hb today: ***, Urine today: ***   reports that she quit smoking about 20 years ago. Her smoking use included cigarettes. She has a 15.00 pack-year smoking history. She has never used smokeless tobacco. She reports current alcohol use of about 4.0 standard drinks of alcohol per week. She reports that she does not use drugs.  Past Medical History:  Diagnosis Date   Allergy    Anxiety 1994   Depression 1994   Migraine    w/and w/o    Past Surgical History:  Procedure Laterality Date   ABLATION ON ENDOMETRIOSIS N/A 2013   uterine   CARDIAC ELECTROPHYSIOLOGY MAPPING AND ABLATION N/A 2017   SVT   TOE SURGERY Right    3rd toe x2   TUBAL LIGATION  2006    Current Outpatient Medications  Medication Sig Dispense Refill   clonazePAM (KLONOPIN) 0.5 MG tablet Take 1 tablet (0.5 mg total) by mouth 2 (two) times daily. 60 tablet 5   ELDERBERRY PO Take by mouth.     Ginkgo Biloba 120 MG TABS      meclizine (ANTIVERT) 25 MG tablet Take one tab every 8hrs as needed for vertigo     Multiple Vitamins-Minerals (YOUR LIFE MULTI ADULT GUMMIES) CHEW      Omega-3 Fatty Acids (FISH OIL) 1000 MG CAPS Take 1,000 mg by mouth daily in the afternoon.     SUMAtriptan (IMITREX) 50 MG tablet TAKE 1 TABLET BY MOUTH AT ONSET OF HEADACHE. MAY REPEAT IN 2 HOURS IF UNRESOLVED - DO NOT EXCEED 200 MG IN 24 HOURS 9 tablet 1   TURMERIC PO       vitamin E 45 MG (100 UNITS) capsule Take by mouth.     No current facility-administered medications for this visit.    Family History  Problem Relation Age of Onset   Anxiety disorder Mother    Depression Mother    Diabetes Father    Learning disabilities Sister    Early death Paternal Uncle    Depression Maternal Grandmother    Heart disease Maternal Grandfather    Heart disease Paternal Grandmother    Learning disabilities Son    Breast cancer Neg Hx     Review of Systems  Exam:   There were no vitals taken for this visit.    General appearance: alert, cooperative and appears stated age Head: normocephalic, without obvious abnormality, atraumatic Neck: no adenopathy, supple, symmetrical, trachea midline and thyroid normal to inspection and palpation Lungs: clear to auscultation bilaterally Breasts: normal appearance, no masses or tenderness, No nipple retraction or dimpling, No nipple discharge or bleeding, No axillary adenopathy Heart: regular rate and rhythm Abdomen: soft, non-tender; no masses, no organomegaly Extremities: extremities normal, atraumatic, no cyanosis or edema Skin: skin color, texture, turgor normal. No rashes or lesions Lymph nodes: cervical, supraclavicular, and axillary nodes normal. Neurologic:  grossly normal  Pelvic: External genitalia:  no lesions              No abnormal inguinal nodes palpated.              Urethra:  normal appearing urethra with no masses, tenderness or lesions              Bartholins and Skenes: normal                 Vagina: normal appearing vagina with normal color and discharge, no lesions              Cervix: no lesions              Pap taken: {yes no:314532} Bimanual Exam:  Uterus:  normal size, contour, position, consistency, mobility, non-tender              Adnexa: no mass, fullness, tenderness              Rectal exam: {yes no:314532}.  Confirms.              Anus:  normal sphincter tone, no lesions  Chaperone was  present for exam:  ***  Assessment:   Well woman visit with gynecologic exam.   Plan: Mammogram screening discussed. Self breast awareness reviewed. Pap and HR HPV as above. Guidelines for Calcium, Vitamin D, regular exercise program including cardiovascular and weight bearing exercise.   Follow up annually and prn.   Additional counseling given.  {yes [id number]. _______ minutes face to face time of which over 50% was spent in counseling.    After visit summary provided.

## 2022-04-14 ENCOUNTER — Telehealth: Payer: Self-pay

## 2022-04-14 ENCOUNTER — Other Ambulatory Visit: Payer: Self-pay | Admitting: Podiatry

## 2022-04-14 DIAGNOSIS — M19071 Primary osteoarthritis, right ankle and foot: Secondary | ICD-10-CM

## 2022-04-14 NOTE — Telephone Encounter (Signed)
She is having surgery on 04/28/2022. She wants to know how long she will be non weight bearing and if she will need a knee scooter?wag

## 2022-04-20 ENCOUNTER — Encounter: Payer: No Typology Code available for payment source | Admitting: Obstetrics and Gynecology

## 2022-04-28 ENCOUNTER — Encounter: Payer: Self-pay | Admitting: Podiatry

## 2022-04-28 ENCOUNTER — Other Ambulatory Visit: Payer: Self-pay | Admitting: Podiatry

## 2022-04-28 DIAGNOSIS — M2011 Hallux valgus (acquired), right foot: Secondary | ICD-10-CM | POA: Diagnosis not present

## 2022-04-28 DIAGNOSIS — D2371 Other benign neoplasm of skin of right lower limb, including hip: Secondary | ICD-10-CM | POA: Diagnosis not present

## 2022-04-28 DIAGNOSIS — M2041 Other hammer toe(s) (acquired), right foot: Secondary | ICD-10-CM | POA: Diagnosis not present

## 2022-04-28 DIAGNOSIS — M21541 Acquired clubfoot, right foot: Secondary | ICD-10-CM | POA: Diagnosis not present

## 2022-04-28 HISTORY — PX: BUNIONECTOMY: SHX129

## 2022-04-28 MED ORDER — CEPHALEXIN 500 MG PO CAPS: 500.0000 mg | ORAL_CAPSULE | Freq: Three times a day (TID) | ORAL | 0 refills | Status: DC

## 2022-04-28 MED ORDER — OXYCODONE-ACETAMINOPHEN 5-325 MG PO TABS: 1.0000 | ORAL_TABLET | Freq: Four times a day (QID) | ORAL | 0 refills | Status: DC | PRN

## 2022-04-28 MED ORDER — IBUPROFEN 800 MG PO TABS: 800.0000 mg | ORAL_TABLET | Freq: Three times a day (TID) | ORAL | 0 refills | Status: DC | PRN

## 2022-05-03 ENCOUNTER — Ambulatory Visit (INDEPENDENT_AMBULATORY_CARE_PROVIDER_SITE_OTHER): Payer: No Typology Code available for payment source | Admitting: Podiatry

## 2022-05-03 ENCOUNTER — Ambulatory Visit (INDEPENDENT_AMBULATORY_CARE_PROVIDER_SITE_OTHER): Payer: No Typology Code available for payment source

## 2022-05-03 DIAGNOSIS — M2011 Hallux valgus (acquired), right foot: Secondary | ICD-10-CM

## 2022-05-03 DIAGNOSIS — M2041 Other hammer toe(s) (acquired), right foot: Secondary | ICD-10-CM

## 2022-05-03 DIAGNOSIS — Z9889 Other specified postprocedural states: Secondary | ICD-10-CM

## 2022-05-03 NOTE — Progress Notes (Unsigned)
Patient presents today for post op visit # 1, patient of Dr. Jacqualyn Posey   POV #1 DOS 04/28/2022 RT FOOT CORRECTION OF BUNION (AUSTIN/AIKEN) SHORTENTING OF 2ND & 3RD METATARSALS, REVISION HAMMERTOE 3RD, RELEASE SCAR TISSUE 3RD TOE    Patient presents in her walking boot, non-weight bearing on crutches. She states she wasn't clear on her instructions after surgery whether she could actually put weight on it or not. Denies any falls or injury to the foot. Foot is slightly swollen. No signs of infection. No calf pain or shortness of breath. Bandages dry and intact. Incision is intact.      Xrays taken today and reviewed by Dr. Jacqualyn Posey. He did take a look at the foot today as well.   Foot redressed today and placed back in the boot. Reviewed icing and elevation. Patient will follow up with Dr. Jacqualyn Posey for POV# 2.   --  I also personally valuated the patient.  Incisions coapted with sutures intact without any surrounding erythema, drainage or pus or ascending cellulitis.  There is mild edema present and mild ecchymosis is noted.  Tenderness palpation at surgical sites.  Toes are rectus.  Dressing reapplied.  Continue cam boot.  Discussed weightbearing to heel as needed, but limited.  X-rays were obtained reviewed.  3 views of the foot were obtained.  Status post first metatarsal osteotomy, Akin bunionectomy with K wire intact as well as metatarsal osteotomy.  No complicating factors noted postoperatively.  Follow-up as scheduled for possible suture removal.  [name]

## 2022-05-05 ENCOUNTER — Encounter: Payer: Self-pay | Admitting: Family Medicine

## 2022-05-05 ENCOUNTER — Ambulatory Visit: Payer: No Typology Code available for payment source | Admitting: Family Medicine

## 2022-05-05 VITALS — BP 112/60 | HR 72 | Temp 98.2°F | Ht 63.0 in | Wt 139.4 lb

## 2022-05-05 DIAGNOSIS — G43109 Migraine with aura, not intractable, without status migrainosus: Secondary | ICD-10-CM

## 2022-05-05 DIAGNOSIS — F419 Anxiety disorder, unspecified: Secondary | ICD-10-CM

## 2022-05-05 MED ORDER — CLONAZEPAM 0.5 MG PO TABS: 0.5000 mg | ORAL_TABLET | Freq: Two times a day (BID) | ORAL | 5 refills | Status: DC

## 2022-05-05 NOTE — Patient Instructions (Signed)

## 2022-05-05 NOTE — Progress Notes (Signed)
Subjective:     Patient ID: Sierra Mayo, female    DOB: [birth year], 52 y.o.   MRN: [id number]  Chief Complaint  Patient presents with   Follow-up    6 month follow-up    HPI-here w/husb  Anxiety-on clonazepam 0.5mg  bid 1-2x/wk but once/d for sure.  Has tried mult other meds-not work   no SI.  Walks to help.  Screen for depression only + because of foot surgery 1 wk ago.  Migraine-<1/mo usu.  2 last mo d/t stress. Some fatigue-started [employer] and doing some better.  Resch gyn for pelvis  Health Maintenance Due  Topic Date Due   PAP SMEAR-Modifier  Never done    Past Medical History:  Diagnosis Date   Allergy    Anxiety 1994   Depression 1994   Migraine    w/and w/o    Past Surgical History:  Procedure Laterality Date   ABLATION ON ENDOMETRIOSIS N/A 2013   uterine   BUNIONECTOMY Right 04/28/2022   and other toes   CARDIAC ELECTROPHYSIOLOGY MAPPING AND ABLATION N/A 2017   SVT   TOE SURGERY Right    3rd toe x2   TUBAL LIGATION  2006    Outpatient Medications Prior to Visit  Medication Sig Dispense Refill   ELDERBERRY PO Take by mouth.     Ginkgo Biloba 120 MG TABS      ibuprofen (ADVIL) 800 MG tablet Take 1 tablet (800 mg total) by mouth every 8 (eight) hours as needed. 30 tablet 0   meclizine (ANTIVERT) 25 MG tablet Take one tab every 8hrs as needed for vertigo     Multiple Vitamins-Minerals (YOUR LIFE MULTI ADULT GUMMIES) CHEW      Omega-3 Fatty Acids (FISH OIL) 1000 MG CAPS Take 1,000 mg by mouth daily in the afternoon.     SUMAtriptan (IMITREX) 50 MG tablet TAKE 1 TABLET BY MOUTH AT ONSET OF HEADACHE. MAY REPEAT IN 2 HOURS IF UNRESOLVED - DO NOT EXCEED 200 MG IN 24 HOURS 9 tablet 1   TURMERIC PO      vitamin E 45 MG (100 UNITS) capsule Take by mouth.     cephALEXin (KEFLEX) 500 MG capsule Take 1 capsule (500 mg total) by mouth 3 (three) times daily. 21 capsule 0   clonazePAM (KLONOPIN) 0.5 MG tablet Take 1 tablet (0.5 mg total) by mouth 2 (two) times daily. 60  tablet 5   oxyCODONE-acetaminophen (PERCOCET/ROXICET) 5-325 MG tablet Take 1-2 tablets by mouth every 6 (six) hours as needed for severe pain. 25 tablet 0   No facility-administered medications prior to visit.    Allergies  Allergen Reactions   Prochlorperazine Anaphylaxis and Palpitations    Other reaction(s): GI Intolerance, GI upset, Other (See Comments) Other reaction(s): spasms dystonia    Amitriptyline     Other reaction(s): AMITRIPTYLINE (extreme fatigue after one dose)   Amitriptyline Hcl     Other reaction(s): AMITRIPTYLINE (extreme fatigue after one dose)   Erythromycin Diarrhea    Other reaction(s): GI Intolerance, GI upset   Propranolol     Other reaction(s): Dizzy / Lightheaded Other reaction(s): Dizzy / Lightheaded    ROS neg/noncontributory except as noted HPI/below      Objective:     BP 112/60   Pulse 72   Temp 98.2 F (36.8 C) (Temporal)   Ht 5\' 3"  (1.6 m)   Wt 139 lb 6 oz (63.2 kg)   SpO2 99%   BMI 24.69 kg/m  Wt Readings from  Last 3 Encounters:  05/05/22 139 lb 6 oz (63.2 kg)  03/16/22 144 lb 12.8 oz (65.7 kg)  01/26/22 142 lb (64.4 kg)    Physical Exam   Gen: WDWN NAD HEENT: NCAT, conjunctiva not injected, sclera nonicteric NECK:  supple, no thyromegaly, no nodes, no carotid bruits CARDIAC: RRR, S1S2+, no murmur. DP 2+B LUNGS: CTAB. No wheezes ABDOMEN:  BS+, soft, NTND, No HSM, no masses EXT:  no edema MSK: R surg boot and crutches.  NEURO: A&O x3.  CN II-XII intact.  PSYCH: normal mood. Good eye contact   Pdmp checked  Assessment & Plan:   Problem List Items Addressed This Visit       Cardiovascular and Mediastinum   Migraine with aura and without status migrainosus, not intractable - Primary   Relevant Medications   clonazePAM (KLONOPIN) 0.5 MG tablet     Other   Anxiety   Migraine-chronic.  Gets 1-2/mo.  Controlled on imitrex 50mg .  Cont Anxiety-chronic.  Well controlled.  Intol many meds.  Doing well on Klonopin 0.5mg   daily and occ bid.  Pdmp checked.  Cont    F/u 6 mo  Meds ordered this encounter  Medications   clonazePAM (KLONOPIN) 0.5 MG tablet    Sig: Take 1 tablet (0.5 mg total) by mouth 2 (two) times daily.    Dispense:  60 tablet    Refill:  5    Wellington Hampshire, MD

## 2022-05-13 ENCOUNTER — Ambulatory Visit: Payer: No Typology Code available for payment source

## 2022-05-18 ENCOUNTER — Ambulatory Visit (INDEPENDENT_AMBULATORY_CARE_PROVIDER_SITE_OTHER): Payer: No Typology Code available for payment source | Admitting: Podiatry

## 2022-05-18 ENCOUNTER — Ambulatory Visit (INDEPENDENT_AMBULATORY_CARE_PROVIDER_SITE_OTHER): Payer: No Typology Code available for payment source

## 2022-05-18 DIAGNOSIS — M2011 Hallux valgus (acquired), right foot: Secondary | ICD-10-CM

## 2022-05-18 DIAGNOSIS — M2041 Other hammer toe(s) (acquired), right foot: Secondary | ICD-10-CM

## 2022-05-18 NOTE — Progress Notes (Signed)
POV #2 DOS 04/28/2022 RT FOOT CORRECTION OF BUNION (AUSTIN/AIKEN) SHORTENTING OF 2ND & 3RD METATARSALS, REVISION HAMMERTOE 3RD, RELEASE SCAR TISSUE 3RD TOE   Pt is in office for POV 2. Pt states she is doing well she just has tightness with prolong walking and she is still not walking without her air fracture walker. NO signs of infection. Suture and pin are intact. Pt was seen by Dr. Jacqualyn Posey.   -  I personally evaluated the patient.  Sutures are intact incisions well coapted without any evidence of dehiscence.  There is no surrounding erythema, ascending cellulitis.  There is no drainage or pus or signs of infection.  Skin intact of the third toe without any drainage or signs of infection as well.  I remove the sutures and incisions well coapted.  Dressing reapplied.  Discussion of loss of soap and water, dry thoroughly apply silver manage may not soak the foot otherwise.  Continue cam boot.  She can use surgical shoe for short distances around the house.  It was also dispensed today for continued immobilization  X-rays obtained reviewed.  Hardware intact with any complicating factors.  Increased consolidation noted across the osteotomy sites.   [name]

## 2022-05-25 ENCOUNTER — Encounter: Payer: No Typology Code available for payment source | Admitting: Podiatry

## 2022-06-01 ENCOUNTER — Telehealth: Payer: Self-pay | Admitting: *Deleted

## 2022-06-01 NOTE — Telephone Encounter (Signed)
Patient is calling and asking for clarification on instructions given in office notes about the post op care of her right foot and what she is supposed to be applying after washing , please advise.

## 2022-06-02 NOTE — Telephone Encounter (Signed)
Returned patient's call to answer questions. Advised patient she could use silvadene cream or antibiotic ointment over the incision after washing and patting the foot dry. Patient verbalized understanding and denied having any further questions.

## 2022-06-11 ENCOUNTER — Ambulatory Visit (INDEPENDENT_AMBULATORY_CARE_PROVIDER_SITE_OTHER): Payer: No Typology Code available for payment source

## 2022-06-11 ENCOUNTER — Ambulatory Visit (INDEPENDENT_AMBULATORY_CARE_PROVIDER_SITE_OTHER): Payer: No Typology Code available for payment source | Admitting: Podiatry

## 2022-06-11 VITALS — BP 130/74

## 2022-06-11 DIAGNOSIS — Z9889 Other specified postprocedural states: Secondary | ICD-10-CM

## 2022-06-11 DIAGNOSIS — M2041 Other hammer toe(s) (acquired), right foot: Secondary | ICD-10-CM

## 2022-06-11 DIAGNOSIS — M2011 Hallux valgus (acquired), right foot: Secondary | ICD-10-CM

## 2022-06-11 NOTE — Progress Notes (Unsigned)
Subjective: Chief Complaint  Patient presents with   Routine Post Op    POV #3 DOS 04/28/2022 (xrays) RT FOOT CORRECTION OF BUNION (AUSTIN/AIKEN) SHORTENTING OF 2ND & 3RD METATARSALS, REVISION HAMMERTOE 3RD, RELEASE SCAR TISSUE 3RD TOE    Sierra Mayo is a 52 y.o. is seen today in office s/p the above procedures.  She states has been having some increased pain she points to the lateral aspect of the foot as well as along the toes.  She presents today for K wire removal.   Objective: General: No acute distress, AAOx3  DP/PT pulses palpable 2/4, CRT < 3 sec to all digits.  Protective sensation intact. Motor function intact.  Right foot: Incision is well coapted without any evidence of dehiscence.  Scars are formed.  K wire intact of the third toenail without any drainage or pus or signs of infection.  There are no open lesions.  There is tenderness palpation surgical site but also to the lateral aspect of the foot.  Minimal edema.  No erythema or warmth.  No signs of infection.   No other open lesions or pre-ulcerative lesions.  No pain with calf compression, swelling, warmth, erythema.   Assessment and Plan:  Status post right foot surgery, pain  -Treatment options discussed including all alternatives, risks, and complications -X-rays obtained reviewed.  3 views of the foot were obtained.  Hardware intact with any complicating factors. -K wire was removed today without complications.  Antibiotic ointment dressing applied. -Weight-bear as tolerated.  Will start physical therapy and referral for [facility] was written.  She can continue with weightbearing as tolerated in the boot and start transition to regular shoe as tolerated pending therapy.  Continue ice, elevate as well as compression. -Pain medication as needed. -Monitor for any clinical signs or symptoms of infection and DVT/PE and directed to call the office immediately should any occur or go to the ER. -Follow-up as  scheduled or sooner if any problems arise. In the meantime, encouraged to call the office with any questions, concerns, change in symptoms.   Ovid Curd, DPM

## 2022-06-15 ENCOUNTER — Telehealth: Payer: Self-pay | Admitting: *Deleted

## 2022-06-15 ENCOUNTER — Other Ambulatory Visit: Payer: Self-pay | Admitting: Podiatry

## 2022-06-15 DIAGNOSIS — M2011 Hallux valgus (acquired), right foot: Secondary | ICD-10-CM

## 2022-06-15 NOTE — Telephone Encounter (Signed)
Patient is wanting a referral to [facility],[address],-fax # [phone],has an appointment scheduled for 06/20/22,would rather go there instead of [facility],please advise.

## 2022-06-16 ENCOUNTER — Encounter: Payer: Self-pay | Admitting: Podiatry

## 2022-06-16 NOTE — Telephone Encounter (Signed)
Patient has been updated that referral has been sent to [facility] thru voice message.

## 2022-07-01 ENCOUNTER — Ambulatory Visit (INDEPENDENT_AMBULATORY_CARE_PROVIDER_SITE_OTHER): Payer: No Typology Code available for payment source

## 2022-07-01 ENCOUNTER — Ambulatory Visit (INDEPENDENT_AMBULATORY_CARE_PROVIDER_SITE_OTHER): Payer: No Typology Code available for payment source | Admitting: Podiatry

## 2022-07-01 DIAGNOSIS — M2011 Hallux valgus (acquired), right foot: Secondary | ICD-10-CM

## 2022-07-01 DIAGNOSIS — Z9889 Other specified postprocedural states: Secondary | ICD-10-CM

## 2022-07-01 DIAGNOSIS — M2041 Other hammer toe(s) (acquired), right foot: Secondary | ICD-10-CM

## 2022-07-01 NOTE — Progress Notes (Signed)
Subjective: Chief Complaint  Patient presents with   Routine Post Op    POV #4 DOS 04/28/2022 (xrays) RT FOOT CORRECTION OF BUNION (AUSTIN/AIKEN) SHORTENTING OF 2ND & 3RD METATARSALS, REVISION HAMMERTOE 3RD, RELEASE SCAR TISSUE 3RD TOE     Km. Francavilla Popiel is a 52 y.o. is seen today in office s/p the above procedures.  She states that she is doing much better since last appointment.  She is back in regular shoe.  Physical therapy has been helping.  She is also reconstructed and she is walk 3 miles at a time.  She has no new concerns.  Objective: General: No acute distress, AAOx3  DP/PT pulses palpable 2/4, CRT < 3 sec to all digits.  Protective sensation intact. Motor function intact.  Right foot: Incision is well coapted without any evidence of dehiscence.  Scars are formed.  Toes are rectus position.  There is improved range of motion of the MPJs.  There is some slight edema present there is no erythema or warmth.  There is no obvious signs of infection noted today.  Toes are rectus. No other open lesions or pre-ulcerative lesions.  No pain with calf compression, swelling, warmth, erythema.   Assessment and Plan:  Status post right foot surgery, improving  -Treatment options discussed including all alternatives, risks, and complications -X-rays obtained reviewed.  3 views of the foot were obtained.  Hardware intact with any complicating factors.  Increased consolidation noted. -Overall she is doing better.  Plan continue physical therapy.  Continue ice, elevate as well as compression.  Gradual activity increase as tolerated.  Return in about 5 weeks (around 08/05/2022) for post-op, x-ray.  Vivi Barrack DPM

## 2022-08-02 ENCOUNTER — Encounter: Payer: Self-pay | Admitting: Radiology

## 2022-08-02 ENCOUNTER — Other Ambulatory Visit (HOSPITAL_COMMUNITY)
Admission: RE | Admit: 2022-08-02 | Discharge: 2022-08-02 | Disposition: A | Discharge status: Home / Self Care | Payer: No Typology Code available for payment source | Source: Ambulatory Visit | Attending: Radiology | Admitting: Radiology

## 2022-08-02 ENCOUNTER — Ambulatory Visit (INDEPENDENT_AMBULATORY_CARE_PROVIDER_SITE_OTHER): Payer: No Typology Code available for payment source | Admitting: Radiology

## 2022-08-02 VITALS — BP 120/76 | Ht 62.0 in | Wt 135.0 lb

## 2022-08-02 DIAGNOSIS — Z01419 Encounter for gynecological examination (general) (routine) without abnormal findings: Secondary | ICD-10-CM

## 2022-08-02 DIAGNOSIS — N952 Postmenopausal atrophic vaginitis: Secondary | ICD-10-CM

## 2022-08-02 MED ORDER — IMVEXXY MAINTENANCE PACK 10 MCG VA INST
1.0000 | VAGINAL_INSERT | VAGINAL | 4 refills | Status: DC
Start: 2022-08-02 — End: 2023-11-01

## 2022-08-02 NOTE — Progress Notes (Signed)
   Sierra Mayo June 10, 1970 [id number]   History:  52 y.o. G2P2 presents for annual exam as a new patient. Moved here from [place] last year. C/o dyspareunia worsening over the past 2 years.   Gynecologic History No LMP recorded (exact date). Patient is postmenopausal.   Sexually active: yes Last Pap: 02/2018. Results were: normal Last mammogram: 06/05/21. Results were: normal  Obstetric History OB History  Gravida Para Term Preterm AB Living  2 2       2   SAB IAB Ectopic Multiple Live Births               # Outcome Date GA Lbr Len/2nd Weight Sex Delivery Anes PTL Lv  2 Para           1 Para              The following portions of the patient's history were reviewed and updated as appropriate: allergies, current medications, past family history, past medical history, past social history, past surgical history, and problem list.  Review of Systems Pertinent items noted in HPI and remainder of comprehensive ROS otherwise negative.   Past medical history, past surgical history, family history and social history were all reviewed and documented in the EPIC chart.   Exam:  Vitals:   08/02/22 1423  BP: 120/76  Weight: 135 lb (61.2 kg)  Height: 5\' 2"  (1.575 m)   Body mass index is 24.69 kg/m.  General appearance:  Normal Thyroid:  Symmetrical, normal in size, without palpable masses or nodularity. Respiratory  Auscultation:  Clear without wheezing or rhonchi Cardiovascular  Auscultation:  Regular rate, without rubs, murmurs or gallops  Edema/varicosities:  Not grossly evident Abdominal  Soft,nontender, without masses, guarding or rebound.  Liver/spleen:  No organomegaly noted  Hernia:  None appreciated  Skin  Inspection:  Grossly normal Breasts: Examined lying and sitting.   Right: Without masses, retractions, nipple discharge or axillary adenopathy.   Left: Without masses, retractions, nipple discharge or axillary adenopathy. Genitourinary   Inguinal/mons:  Normal  without inguinal adenopathy  External genitalia:  Normal appearing vulva with no masses, tenderness, or lesions  BUS/Urethra/Skene's glands:  Normal without masses or exudate  Vagina:  Moderately atrophic appearing with normal color and discharge, no lesions  Cervix:  Normal appearing without discharge or lesions  Uterus:  Normal in size, shape and contour.  Mobile, nontender  Adnexa/parametria:     Rt: Normal in size, without masses or tenderness.   Lt: Normal in size, without masses or tenderness.  Anus and perineum: Normal   [name], CMA present for exam  Assessment/Plan:   1. Well woman exam with routine gynecological exam - Cytology - PAP( [facility])  2. Vaginal atrophy - Estradiol (IMVEXXY MAINTENANCE PACK) 10 MCG INST; Place 1 tablet vaginally 2 (two) times a week.  Dispense: 24 each; Refill: 4     Discussed SBE, colonoscopy and DEXA screening as directed/appropriate. Recommend of exercise weekly, including weight bearing exercise. Encouraged the use of seatbelts and sunscreen. Return in 1 year for annual or as needed.   Arlie Solomons B WHNP-BC 2:53 PM 08/02/2022

## 2022-08-03 LAB — CYTOLOGY - PAP
Adequacy: ABSENT
Comment: NEGATIVE
Diagnosis: NEGATIVE
High risk HPV: NEGATIVE

## 2022-08-04 ENCOUNTER — Other Ambulatory Visit: Payer: Self-pay

## 2022-08-04 MED ORDER — METRONIDAZOLE 0.75 % VA GEL: 1.0000 | Freq: Every day | VAGINAL | 0 refills | Status: AC

## 2022-08-05 ENCOUNTER — Ambulatory Visit (INDEPENDENT_AMBULATORY_CARE_PROVIDER_SITE_OTHER): Payer: No Typology Code available for payment source

## 2022-08-05 ENCOUNTER — Ambulatory Visit: Payer: No Typology Code available for payment source | Admitting: Podiatry

## 2022-08-05 DIAGNOSIS — M2011 Hallux valgus (acquired), right foot: Secondary | ICD-10-CM | POA: Diagnosis not present

## 2022-08-05 DIAGNOSIS — Z9889 Other specified postprocedural states: Secondary | ICD-10-CM

## 2022-08-05 DIAGNOSIS — M2041 Other hammer toe(s) (acquired), right foot: Secondary | ICD-10-CM

## 2022-08-05 NOTE — Progress Notes (Signed)
Subjective: Chief Complaint  Patient presents with   Routine Post Op      Sierra Mayo is a 52 y.o. is seen today in office s/p the above procedures.  She states that it is "a lot better than what it was".  She still been doing the physical therapy she has been walking 2 to 5 miles per day.  She is wearing regular shoe gear as well.  Pain is much improved.  Objective: General: No acute distress, AAOx3  DP/PT pulses palpable 2/4, CRT < 3 sec to all digits.  Protective sensation intact. Motor function intact.  Right foot: Incision is well coapted without any evidence of dehiscence.  Scars are formed.  Toes are rectus position.  There is improved range of motion of the MPJs.  Range of motion intact except the third toe which has previously been fused.  There is no significant pain on exam.  No severe edema there is no erythema warmth.  Toes are rectus. No pain with calf compression, swelling, warmth, erythema.   Assessment and Plan:  Status post right foot surgery, improving  -Treatment options discussed including all alternatives, risks, and complications -X-rays obtained reviewed.  3 views of the foot were obtained.  Hardware intact with any complicating factors.  Increased consolidation noted. -Overall she is doing better.  She been doing physical therapy we can discontinue this as she has been doing well.  Continue ice, elevate as well as compression.  Gradual increase activity as tolerated. -At this point since she is doing well and is see her back on an as-needed basis and she agrees with this plan.  Encouraged to call any questions or concerns or any changes.  Return if symptoms worsen or fail to improve.  Vivi Barrack DPM

## 2022-09-21 ENCOUNTER — Ambulatory Visit: Payer: No Typology Code available for payment source | Admitting: Family Medicine

## 2022-09-27 ENCOUNTER — Encounter: Payer: Self-pay | Admitting: Family Medicine

## 2022-09-27 ENCOUNTER — Ambulatory Visit: Payer: 59 | Admitting: Family Medicine

## 2022-09-27 VITALS — BP 114/80 | HR 77 | Temp 98.4°F | Ht 62.0 in | Wt 139.8 lb

## 2022-09-27 DIAGNOSIS — H6992 Unspecified Eustachian tube disorder, left ear: Secondary | ICD-10-CM

## 2022-09-27 NOTE — Patient Instructions (Signed)
It was very nice to see you today!  Flonase 2 sprays each nostril twice daily for 2 weeks.  Worse, changes, let me know    PLEASE NOTE:  If you had any lab tests please let us know if you have not heard back within a few days. You may see your results on MyChart before we have a chance to review them but we will give you a call once they are reviewed by [place]. If we ordered any referrals today, please let us know if you have not heard from their office within the next week.   Please try these tips to maintain a healthy lifestyle:  Eat most of your calories during the day when you are active. Eliminate processed foods including packaged sweets (pies, cakes, cookies), reduce intake of potatoes, white bread, white pasta, and white rice. Look for whole grain options, oat flour or almond flour.  Each meal should contain half fruits/vegetables, one quarter protein, and one quarter carbs (no bigger than a computer mouse).  Cut down on sweet beverages. This includes juice, soda, and sweet tea. Also watch fruit intake, though this is a healthier sweet option, it still contains natural sugar! Limit to 3 servings daily.  Drink at least 1 glass of water with each meal and aim for at least 8 glasses per day  Exercise at least 150 minutes every week.

## 2022-09-27 NOTE — Progress Notes (Signed)
Subjective:     Patient ID: Sierra Mayo, female    DOB: [birth year], 52 y.o.   MRN: [id number]  Chief Complaint  Patient presents with   Ear Fullness    HPI  L ear issues - She complains of left ear "swooshing" sound. She notes she hears the swooshing when coming up from bending down or when turning her head. She describes it as "an ocean" in her ear. She denies swimming or submerging her head in water recently-did have similar symptoms in past, but was swimming a lot at the time. . No hearing loss, dizziness, ear pain, rhinorrhea, or congestion.   Vertigo - She reports one episode of vertigo in the last year. Unsure of any known triggers.- just concerned that this will trigger again   Health Maintenance Due  Topic Date Due   COVID-19 Vaccine (5 - 2023-24 season) 02/08/2022   INFLUENZA VACCINE  09/16/2022    Past Medical History:  Diagnosis Date   Allergy    Anxiety 1994   Depression 1994   Migraine    w/and w/o    Past Surgical History:  Procedure Laterality Date   ABLATION ON ENDOMETRIOSIS N/A 2013   uterine   BUNIONECTOMY Right 04/28/2022   and other toes   CARDIAC ELECTROPHYSIOLOGY MAPPING AND ABLATION N/A 2017   SVT   TOE SURGERY Right    3rd toe x2 04/2022   TUBAL LIGATION  2006     Current Outpatient Medications:    B Complex Vitamins (B COMPLEX PO), Take by mouth., Disp: , Rfl:    clonazePAM (KLONOPIN) 0.5 MG tablet, Take 1 tablet (0.5 mg total) by mouth 2 (two) times daily., Disp: 60 tablet, Rfl: 5   ELDERBERRY PO, Take by mouth., Disp: , Rfl:    Estradiol (IMVEXXY MAINTENANCE PACK) 10 MCG INST, Place 1 tablet vaginally 2 (two) times a week., Disp: 24 each, Rfl: 4   Ginkgo Biloba 120 MG TABS, , Disp: , Rfl:    meclizine (ANTIVERT) 25 MG tablet, Take one tab every 8hrs as needed for vertigo, Disp: , Rfl:    Multiple Vitamins-Minerals (YOUR LIFE MULTI ADULT GUMMIES) CHEW, , Disp: , Rfl:    Omega-3 Fatty Acids (FISH OIL) 1000 MG CAPS, Take 1,000 mg by  mouth daily in the afternoon., Disp: , Rfl:    SUMAtriptan (IMITREX) 50 MG tablet, TAKE 1 TABLET BY MOUTH AT ONSET OF HEADACHE. MAY REPEAT IN 2 HOURS IF UNRESOLVED - DO NOT EXCEED 200 MG IN 24 HOURS, Disp: 9 tablet, Rfl: 1   TURMERIC PO, , Disp: , Rfl:    vitamin E 45 MG (100 UNITS) capsule, Take by mouth., Disp: , Rfl:    NON FORMULARY, [place], Disp: , Rfl:   Allergies  Allergen Reactions   Prochlorperazine Anaphylaxis and Palpitations    Other reaction(s): GI Intolerance, GI upset, Other (See Comments) Other reaction(s): spasms dystonia    Amitriptyline     Other reaction(s): AMITRIPTYLINE (extreme fatigue after one dose)   Amitriptyline Hcl     Other reaction(s): AMITRIPTYLINE (extreme fatigue after one dose)   Erythromycin Diarrhea    Other reaction(s): GI Intolerance, GI upset   Propranolol     Other reaction(s): Dizzy / Lightheaded Other reaction(s): Dizzy / Lightheaded    ROS neg/noncontributory except as noted HPI/below      Objective:     BP 114/80 (BP Location: Left Arm, Patient Position: Sitting, Cuff Size: Normal)   Pulse 77   Temp 98.4  F (36.9 C) (Temporal)   Ht 5\' 2"  (1.575 m)   Wt 139 lb 12.8 oz (63.4 kg)   SpO2 96%   BMI 25.57 kg/m  Wt Readings from Last 3 Encounters:  09/27/22 139 lb 12.8 oz (63.4 kg)  08/02/22 135 lb (61.2 kg)  05/05/22 139 lb 6 oz (63.2 kg)    Physical Exam   Gen: WDWN NAD HEENT: NCAT, conjunctiva not injected, sclera nonicteric. TM WNL B, OP moist, no exudates. +Turbinates a bit enlarged. NECK:  supple, no thyromegaly, no nodes, no carotid bruits MSK: no gross abnormalities.  NEURO: A&O x3.  CN II-XII intact.  PSYCH: normal mood. Good eye contact     Assessment & Plan:  Eustachian tube dysfunction, left  1.  Eustachian tube dysfunction left-most probable etiology.  Take Flonase 2 sprays each nostril twice daily for 2 weeks.  Monitor for any changes.  If not improving, consider ear nose throat referral.  I do not  think this is carotid stenosis.  Declined Dopplers for now.  Hopefully, not a precursor to vertigo.  Monitor closely.  Return if symptoms worsen or fail to improve.   I,Rachel Rivera,acting as a scribe for Angelena Sole, MD.,have documented all relevant documentation on the behalf of Angelena Sole, MD,as directed by  Angelena Sole, MD while in the presence of Angelena Sole, MD.  I, Angelena Sole, MD, have reviewed all documentation for this visit. The documentation on 09/27/22 for the exam, diagnosis, procedures, and orders are all accurate and complete.   Angelena Sole, MD

## 2022-09-30 ENCOUNTER — Encounter (INDEPENDENT_AMBULATORY_CARE_PROVIDER_SITE_OTHER): Payer: Self-pay

## 2022-10-19 ENCOUNTER — Other Ambulatory Visit: Payer: Self-pay | Admitting: Oncology

## 2022-10-19 DIAGNOSIS — Z006 Encounter for examination for normal comparison and control in clinical research program: Secondary | ICD-10-CM

## 2022-10-22 ENCOUNTER — Telehealth: Payer: Self-pay | Admitting: Podiatry

## 2022-10-22 NOTE — Telephone Encounter (Signed)
Left message for pt with the advise from [place] p and told pt to call to get scheduled for Monday to have it x-ray and checked.

## 2022-10-22 NOTE — Telephone Encounter (Signed)
Pt called and had surgery in march and while getting dressed today putting her foot into her pants her toe that was fused got bent and she heard a pop. She said it is uncomfortable currently. What does she need to do?

## 2022-10-25 ENCOUNTER — Ambulatory Visit (INDEPENDENT_AMBULATORY_CARE_PROVIDER_SITE_OTHER): Payer: 59

## 2022-10-25 ENCOUNTER — Ambulatory Visit (INDEPENDENT_AMBULATORY_CARE_PROVIDER_SITE_OTHER): Payer: 59 | Admitting: Podiatry

## 2022-10-25 DIAGNOSIS — M2011 Hallux valgus (acquired), right foot: Secondary | ICD-10-CM

## 2022-10-25 DIAGNOSIS — S92534A Nondisplaced fracture of distal phalanx of right lesser toe(s), initial encounter for closed fracture: Secondary | ICD-10-CM | POA: Diagnosis not present

## 2022-10-25 NOTE — Progress Notes (Signed)
Subjective: No chief complaint on file.   DOI on Friday 10/22/2022  52 year old female presents the office today with concerns of pain to the third toe on the right foot.  She states that she bent her toe back and she has had pain and she had swelling and bruising.  She was able to walk 5 miles yesterday without any  significant discomfort.  She has a goal sore but otherwise has been doing well.  No recent treatment.  No other concerns today.  Objective: AAO x3, NAD DP/PT pulses palpable bilaterally, CRT less than 3 seconds On the right third toe there is localized bruising and swelling present along the DIPJ, distal aspect.  There is no blood under the toenail.  There is no edema, erythema or signs of infection otherwise.  There is no other areas of discomfort identified.  No open lesions. No pain with calf compression, swelling, warmth, erythema  Assessment: Contusion right third toe  Plan: -All treatment options discussed with the patient including all alternatives, risks, complications.  -X-rays obtained reviewed.  3 views of the foot were obtained.  Small radiolucency noted along the distal phalanx laterally consistent with a closed nondisplaced fracture. -We discussed buddy splinting the toe.  Ice, elevation.  Stiffer soled shoes -Patient encouraged to call the office with any questions, concerns, change in symptoms.   Return if symptoms worsen or fail to improve.  Vivi Barrack DPM

## 2022-10-29 ENCOUNTER — Ambulatory Visit: Payer: 59 | Admitting: Family Medicine

## 2022-10-29 ENCOUNTER — Encounter: Payer: Self-pay | Admitting: Family Medicine

## 2022-10-29 VITALS — BP 124/77 | HR 65 | Temp 98.0°F | Resp 16 | Ht 62.0 in | Wt 137.0 lb

## 2022-10-29 DIAGNOSIS — F419 Anxiety disorder, unspecified: Secondary | ICD-10-CM

## 2022-10-29 DIAGNOSIS — G43109 Migraine with aura, not intractable, without status migrainosus: Secondary | ICD-10-CM | POA: Diagnosis not present

## 2022-10-29 MED ORDER — CLONAZEPAM 0.5 MG PO TABS: 0.5000 mg | ORAL_TABLET | Freq: Two times a day (BID) | ORAL | 5 refills | Status: DC

## 2022-10-29 MED ORDER — SUMATRIPTAN SUCCINATE 50 MG PO TABS: ORAL_TABLET | ORAL | 1 refills | Status: AC

## 2022-10-29 NOTE — Patient Instructions (Signed)

## 2022-10-29 NOTE — Assessment & Plan Note (Signed)
Chronic.  Well-controlled on clonazepam 0.5 mg daily to twice daily.  PDMP checked.  She has tried multiple other medications.  Continue.

## 2022-10-29 NOTE — Assessment & Plan Note (Signed)
Chronic.  Well-controlled.  Imitrex 50 mg working well.

## 2022-10-29 NOTE — Progress Notes (Signed)
Subjective:     Patient ID: Sierra Mayo, female    DOB: 13-Oct-1970, 52 y.o.   MRN: [id number]  Chief Complaint  Patient presents with   Anxiety    6 month follow-up on anxiety    HPI  Anxiety - Taking clonazepam 0.5 mg once daily. On days of increased anxiety, she takes 2 tablets a day. Expects to go up to 2 clonazepam a day with weather change. Exercising helps with her mental health, does yoga 4x/week.  She endorses associated periodic heart palpitations, CP, and panic attacks. She states her son and his pregnant girlfriend will soon be moving in with her, which has increased her anxiety lately. No v/d or SI.   Migraine - Pt reports her migraines have improved and has only needed to take Imitrex 1-2 times since her last visit. Overall Imitrex did work when needed. States that weather changes may contribute to future headaches.   Immunizations - Pt advised on receiving Covid/flu vaccines  She reports a recent toe fracture on same toe that was fused.   There are no preventive care reminders to display for this patient.   Past Medical History:  Diagnosis Date   Allergy    Anxiety 1994   Depression 1994   Migraine    w/and w/o    Past Surgical History:  Procedure Laterality Date   ABLATION ON ENDOMETRIOSIS N/A 2013   uterine   BUNIONECTOMY Right 04/28/2022   and other toes   CARDIAC ELECTROPHYSIOLOGY MAPPING AND ABLATION N/A 2017   SVT   TOE SURGERY Right    3rd toe x2 04/2022   TUBAL LIGATION  2006     Current Outpatient Medications:    B Complex Vitamins (B COMPLEX PO), Take by mouth., Disp: , Rfl:    ELDERBERRY PO, Take by mouth., Disp: , Rfl:    Estradiol (IMVEXXY MAINTENANCE PACK) 10 MCG INST, Place 1 tablet vaginally 2 (two) times a week., Disp: 24 each, Rfl: 4   Ginkgo Biloba 120 MG TABS, , Disp: , Rfl:    meclizine (ANTIVERT) 25 MG tablet, Take one tab every 8hrs as needed for vertigo, Disp: , Rfl:    Multiple Vitamins-Minerals (YOUR LIFE MULTI ADULT  GUMMIES) CHEW, , Disp: , Rfl:    Omega-3 Fatty Acids (FISH OIL) 1000 MG CAPS, Take 1,000 mg by mouth daily in the afternoon., Disp: , Rfl:    TURMERIC PO, , Disp: , Rfl:    vitamin E 45 MG (100 UNITS) capsule, Take by mouth., Disp: , Rfl:    clonazePAM (KLONOPIN) 0.5 MG tablet, Take 1 tablet (0.5 mg total) by mouth 2 (two) times daily., Disp: 60 tablet, Rfl: 5   SUMAtriptan (IMITREX) 50 MG tablet, TAKE 1 TABLET BY MOUTH AT ONSET OF HEADACHE. MAY REPEAT IN 2 HOURS IF UNRESOLVED - DO NOT EXCEED 200 MG IN 24 HOURS, Disp: 9 tablet, Rfl: 1  Allergies  Allergen Reactions   Prochlorperazine Anaphylaxis and Palpitations    Other reaction(s): GI Intolerance, GI upset, Other (See Comments) Other reaction(s): spasms dystonia    Amitriptyline     Other reaction(s): AMITRIPTYLINE (extreme fatigue after one dose)   Amitriptyline Hcl     Other reaction(s): AMITRIPTYLINE (extreme fatigue after one dose)   Erythromycin Diarrhea    Other reaction(s): GI Intolerance, GI upset   Propranolol     Other reaction(s): Dizzy / Lightheaded Other reaction(s): Dizzy / Lightheaded    ROS neg/noncontributory except as noted HPI/below  Objective:     BP 124/77   Pulse 65   Temp 98 F (36.7 C) (Temporal)   Resp 16   Ht 5\' 2"  (1.575 m)   Wt 137 lb (62.1 kg)   SpO2 99%   BMI 25.06 kg/m  Wt Readings from Last 3 Encounters:  10/29/22 137 lb (62.1 kg)  09/27/22 139 lb 12.8 oz (63.4 kg)  08/02/22 135 lb (61.2 kg)    Physical Exam   Gen: WDWN NAD HEENT: NCAT, conjunctiva not injected, sclera nonicteric NECK:  supple, no thyromegaly, no nodes, no carotid bruits CARDIAC: RRR, S1S2+, no murmur. DP 2+B LUNGS: CTAB. No wheezes ABDOMEN:  BS+, soft, NTND, No HSM, no masses EXT:  no edema MSK: no gross abnormalities.  NEURO: A&O x3.  CN II-XII intact.  PSYCH: normal mood. Good eye contact   PDMP reviewed today, no red flags   Assessment & Plan:  Migraine with aura and without status migrainosus,  not intractable Assessment & Plan: Chronic.  Well-controlled.  Imitrex 50 mg working well.   Anxiety Assessment & Plan: Chronic.  Well-controlled on clonazepam 0.5 mg daily to twice daily.  PDMP checked.  She has tried multiple other medications.  Continue.   Other orders -     clonazePAM; Take 1 tablet (0.5 mg total) by mouth 2 (two) times daily.  Dispense: 60 tablet; Refill: 5 -     SUMAtriptan Succinate; TAKE 1 TABLET BY MOUTH AT ONSET OF HEADACHE. MAY REPEAT IN 2 HOURS IF UNRESOLVED - DO NOT EXCEED 200 MG IN 24 HOURS  Dispense: 9 tablet; Refill: 1    Return in about 6 months (around 04/28/2023) for annual physical/anxiety.   I,Rachel Rivera,acting as a scribe for Angelena Sole, MD.,have documented all relevant documentation on the behalf of Angelena Sole, MD,as directed by  Angelena Sole, MD while in the presence of Angelena Sole, MD.  I, Angelena Sole, MD, have reviewed all documentation for this visit. The documentation on 10/29/22 for the exam, diagnosis, procedures, and orders are all accurate and complete.    Angelena Sole, MD

## 2022-12-24 ENCOUNTER — Ambulatory Visit (HOSPITAL_BASED_OUTPATIENT_CLINIC_OR_DEPARTMENT_OTHER)
Admission: RE | Admit: 2022-12-24 | Discharge: 2022-12-24 | Disposition: A | Discharge status: Home / Self Care | Payer: 59 | Source: Ambulatory Visit

## 2022-12-24 ENCOUNTER — Encounter (HOSPITAL_BASED_OUTPATIENT_CLINIC_OR_DEPARTMENT_OTHER): Payer: Self-pay | Admitting: Radiology

## 2022-12-24 DIAGNOSIS — Z1231 Encounter for screening mammogram for malignant neoplasm of breast: Secondary | ICD-10-CM | POA: Insufficient documentation

## 2023-01-07 ENCOUNTER — Encounter: Payer: Self-pay | Admitting: Family Medicine

## 2023-01-10 NOTE — Telephone Encounter (Signed)
Lvm requesting call back to schedule an appt

## 2023-04-29 ENCOUNTER — Ambulatory Visit: Payer: 59 | Admitting: Podiatry

## 2023-04-29 ENCOUNTER — Ambulatory Visit: Payer: 59 | Admitting: Family Medicine

## 2023-04-29 ENCOUNTER — Encounter: Payer: Self-pay | Admitting: Family Medicine

## 2023-04-29 VITALS — BP 117/76 | HR 64 | Temp 98.3°F | Resp 16 | Ht 62.0 in | Wt 139.0 lb

## 2023-04-29 DIAGNOSIS — Z0000 Encounter for general adult medical examination without abnormal findings: Secondary | ICD-10-CM | POA: Diagnosis not present

## 2023-04-29 DIAGNOSIS — Z1159 Encounter for screening for other viral diseases: Secondary | ICD-10-CM

## 2023-04-29 DIAGNOSIS — F419 Anxiety disorder, unspecified: Secondary | ICD-10-CM | POA: Diagnosis not present

## 2023-04-29 DIAGNOSIS — G43109 Migraine with aura, not intractable, without status migrainosus: Secondary | ICD-10-CM | POA: Diagnosis not present

## 2023-04-29 LAB — CBC WITH DIFFERENTIAL/PLATELET
Basophils Absolute: 0 10*3/uL (ref 0.0–0.1)
Basophils Relative: 0.7 % (ref 0.0–3.0)
Eosinophils Absolute: 0.1 10*3/uL (ref 0.0–0.7)
Eosinophils Relative: 2.5 % (ref 0.0–5.0)
HCT: 41.7 % (ref 36.0–46.0)
Hemoglobin: 13.8 g/dL (ref 12.0–15.0)
Lymphocytes Relative: 29.5 % (ref 12.0–46.0)
Lymphs Abs: 1.7 10*3/uL (ref 0.7–4.0)
MCHC: 33 g/dL (ref 30.0–36.0)
MCV: 89.2 fl (ref 78.0–100.0)
Monocytes Absolute: 0.5 10*3/uL (ref 0.1–1.0)
Monocytes Relative: 7.7 % (ref 3.0–12.0)
Neutro Abs: 3.5 10*3/uL (ref 1.4–7.7)
Neutrophils Relative %: 59.6 % (ref 43.0–77.0)
Platelets: 265 10*3/uL (ref 150.0–400.0)
RBC: 4.68 Mil/uL (ref 3.87–5.11)
RDW: 13.3 % (ref 11.5–15.5)
WBC: 5.9 10*3/uL (ref 4.0–10.5)

## 2023-04-29 LAB — LIPID PANEL
Cholesterol: 232 mg/dL — ABNORMAL HIGH (ref 0–200)
HDL: 93.4 mg/dL (ref >39.00)
LDL Cholesterol: 125 mg/dL — ABNORMAL HIGH (ref 0–99)
NonHDL: 138.15
Total CHOL/HDL Ratio: 2
Triglycerides: 65 mg/dL (ref 0.0–149.0)
VLDL: 13 mg/dL (ref 0.0–40.0)

## 2023-04-29 LAB — COMPREHENSIVE METABOLIC PANEL
ALT: 20 U/L (ref 0–35)
AST: 22 U/L (ref 0–37)
Albumin: 4.6 g/dL (ref 3.5–5.2)
Alkaline Phosphatase: 69 U/L (ref 39–117)
BUN: 13 mg/dL (ref 6–23)
CO2: 29 meq/L (ref 19–32)
Calcium: 9.5 mg/dL (ref 8.4–10.5)
Chloride: 104 meq/L (ref 96–112)
Creatinine, Ser: 0.69 mg/dL (ref 0.40–1.20)
GFR: 97.96 mL/min (ref >60.00)
Glucose, Bld: 94 mg/dL (ref 70–99)
Potassium: 4.3 meq/L (ref 3.5–5.1)
Sodium: 141 meq/L (ref 135–145)
Total Bilirubin: 0.6 mg/dL (ref 0.2–1.2)
Total Protein: 6.7 g/dL (ref 6.0–8.3)

## 2023-04-29 LAB — HEMOGLOBIN A1C: Hgb A1c MFr Bld: 5.8 % (ref 4.6–6.5)

## 2023-04-29 LAB — TSH: TSH: 1.73 u[IU]/mL (ref 0.35–5.50)

## 2023-04-29 MED ORDER — CLONAZEPAM 0.5 MG PO TABS: 0.5000 mg | ORAL_TABLET | Freq: Two times a day (BID) | ORAL | 5 refills | Status: DC

## 2023-04-29 NOTE — Patient Instructions (Signed)

## 2023-04-29 NOTE — Assessment & Plan Note (Signed)
Chronic.  Well-controlled.  Imitrex 50 mg working well.

## 2023-04-29 NOTE — Assessment & Plan Note (Signed)
 Chronic.  Well-controlled on clonazepam 0.5 mg twice daily.  PDMP checked.  She has tried multiple other medications.  Continue.

## 2023-04-29 NOTE — Progress Notes (Signed)
 Phone [phone]   Subjective:   Patient is a 53 y.o. female presenting for annual physical.    Chief Complaint  Patient presents with   Annual Exam    CPE Fasting     Annual-exercising Anxiety - Taking clonazepam 0.5 mg bid. . Exercising helps with her mental health, does yoga 4x/week.  She endorses associated periodic heart palpitations, CP, and panic attacks. Son and gf and baby living w/her.  No SI.  Has tried SSRI and SE or "out of sorts"  Migraine - Pt reports her migraines have improved and has only needed to take Imitrex 1-2 times/month   States that weather changes may contribute to future headaches  See problem oriented charting- ROS- ROS: Gen: no fever, chills  Skin: no rash, itching-seeing derm 05/26/23 ENT: no ear pain, ear drainage, nasal congestion, rhinorrhea, sinus pressure, sore throat--congestion at times for pollen.  Eyes: no blurry vision, double vision Resp: no cough, wheeze,SOB CV: no CP, palpitations, LE edema,  GI: no heartburn, n/v/d/c, abd pain GU: no dysuria, urgency, frequency, hematuria MSK: toe pain back-will see pod.  Medial knee L when exercises.  Neuro: no dizziness,, weakness, vertigo Psych: HPI  The following were reviewed and entered/updated in epic: Past Medical History:  Diagnosis Date   Allergy    Anxiety 1994   Depression 1994   Migraine    w/and w/o   Patient Active Problem List   Diagnosis Date Noted   Migraine with aura and without status migrainosus, not intractable 05/04/2021   Anxiety 05/04/2021   SVT (supraventricular tachycardia) (HCC) 05/04/2021   Past Surgical History:  Procedure Laterality Date   ABLATION ON ENDOMETRIOSIS N/A 2013   uterine   BUNIONECTOMY Right 04/28/2022   and other toes   CARDIAC ELECTROPHYSIOLOGY MAPPING AND ABLATION N/A 2017   SVT   TOE SURGERY Right    3rd toe x2 04/2022   TUBAL LIGATION  2006    Family History  Problem Relation Age of Onset   Anxiety disorder Mother     Depression Mother    Diabetes Father    Learning disabilities Sister    Early death Paternal Uncle    Depression Maternal Grandmother    Heart disease Maternal Grandfather    Heart disease Paternal Grandmother    Learning disabilities Son    Breast cancer Neg Hx     Medications- reviewed and updated Current Outpatient Medications  Medication Sig Dispense Refill   B Complex Vitamins (B COMPLEX PO) Take by mouth.     ELDERBERRY PO Take by mouth.     Estradiol (IMVEXXY MAINTENANCE PACK) 10 MCG INST Place 1 tablet vaginally 2 (two) times a week. 24 each 4   Ginkgo Biloba 120 MG TABS      meclizine (ANTIVERT) 25 MG tablet Take one tab every 8hrs as needed for vertigo     Multiple Vitamins-Minerals (YOUR LIFE MULTI ADULT GUMMIES) CHEW      Omega-3 Fatty Acids (FISH OIL) 1000 MG CAPS Take 1,000 mg by mouth daily in the afternoon.     SUMAtriptan (IMITREX) 50 MG tablet TAKE 1 TABLET BY MOUTH AT ONSET OF HEADACHE. MAY REPEAT IN 2 HOURS IF UNRESOLVED - DO NOT EXCEED 200 MG IN 24 HOURS 9 tablet 1   TURMERIC PO      clonazePAM (KLONOPIN) 0.5 MG tablet Take 1 tablet (0.5 mg total) by mouth 2 (two) times daily. 60 tablet 5   No current facility-administered medications for this visit.  Allergies-reviewed and updated Allergies  Allergen Reactions   Prochlorperazine Anaphylaxis and Palpitations    Other reaction(s): GI Intolerance, GI upset, Other (See Comments) Other reaction(s): spasms dystonia    Amitriptyline     Other reaction(s): AMITRIPTYLINE (extreme fatigue after one dose)   Amitriptyline Hcl     Other reaction(s): AMITRIPTYLINE (extreme fatigue after one dose)   Erythromycin Diarrhea    Other reaction(s): GI Intolerance, GI upset   Propranolol     Other reaction(s): Dizzy / Lightheaded Other reaction(s): Dizzy / Lightheaded     Social History   Social History Narrative   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 DP 2+B LUNGS: CTAB. No wheezes ABDOMEN:  BS+, soft, NTND, No HSM, no masses EXT:  no edema MSK: no gross abnormalities. MS 5/5 all 4 NEURO: A&O x3.  CN II-XII intact.  PSYCH: normal mood. Good eye contact     Assessment and Plan   Health Maintenance counseling: 1. Anticipatory guidance: Patient counseled regarding regular dental exams q6 months, eye exams,  avoiding smoking and second hand smoke, limiting alcohol to 1 beverage per day, no illicit drugs.   2. Risk factor reduction:  Advised patient of need for regular exercise and diet rich and fruits and vegetables to reduce risk of heart attack and stroke. Exercise- +.  Wt Readings from Last 3 Encounters:  04/29/23 139 lb (63 kg)  10/29/22 137 lb (62.1 kg)  09/27/22 139 lb 12.8 oz (63.4 kg)   3. Immunizations/screenings/ancillary studies Immunization History  Administered Date(s) Administered   Influenza Inj Mdck Quad Pf 11/04/2018, 01/07/2020   Influenza-Unspecified 12/17/2004, 12/14/2005, 01/01/2009, 02/23/2011, 02/03/2012, 07/03/2017   Moderna SARS-COV2 Booster Vaccination 11/29/2020   Moderna Sars-Covid-2 Vaccination 05/31/2019, 06/28/2019   PFIZER(Purple Top)SARS-COV-2 Vaccination 01/07/2020   PPD Test 01/27/2015   Pfizer Covid-19 Vaccine Bivalent Booster 21yrs & up 12/14/2021   Tdap 04/06/2007, 11/20/2018   Zoster Recombinant(Shingrix) 10/07/2018, 12/27/2018   Health Maintenance Due  Topic Date Due   HIV Screening  Never done    4. Cervical cancer screening- utd 5. Breast cancer screening-  mammogram utd 6. Colon cancer screening - utd 7. Skin cancer screening- advised regular  sunscreen use. Denies worrisome, changing, or new skin lesions.  8. Birth control/STD check- n/a 9. Osteoporosis screening- n/a 10. Smoking associated screening - non smoker  Wellness examination -     CBC with Differential/Platelet -     Comprehensive metabolic panel -     Lipid panel -     TSH -     Hemoglobin A1c -     HIV Antibody (routine testing w rflx)  Anxiety Assessment & Plan: Chronic.  Well-controlled on clonazepam 0.5 mg twice daily.  PDMP checked.  She has tried multiple other medications.  Continue.  Orders: -     clonazePAM; Take 1 tablet (0.5 mg total) by mouth 2 (two) times daily.  Dispense: 60 tablet; Refill: 5  Migraine with aura and without status migrainosus, not intractable Assessment & Plan: Chronic.  Well-controlled.  Imitrex 50 mg working well.   Screening for viral disease -     HIV Antibody (routine testing w rflx)   Wellness-anticipatory guidance.  Work on Diet/Exercise  Check CBC,CMP,lipids,TSH, A1C.  F/u 1 yr   Recommended follow up: Return in about 6 months (around 10/30/2023) for mood.  Lab/Order associations:+ fasting  Angelena Sole, MD

## 2023-04-30 LAB — HIV ANTIBODY (ROUTINE TESTING W REFLEX): HIV 1&2 Ab, 4th Generation: NONREACTIVE

## 2023-05-01 ENCOUNTER — Encounter: Payer: Self-pay | Admitting: Family Medicine

## 2023-05-01 NOTE — Progress Notes (Signed)
 Labs great except 1.  A1C(3 month average of sugars) is elevated.  This is considered PreDiabetes.  Work on diet-decrease sugars and starches and aim for 30 minutes of exercise 5 days/week to prevent progression to diabetes  2.  Cholesterol acceptable, but as always, work on diet/exercise

## 2023-05-05 ENCOUNTER — Other Ambulatory Visit: Payer: Self-pay | Admitting: Family Medicine

## 2023-05-05 DIAGNOSIS — F419 Anxiety disorder, unspecified: Secondary | ICD-10-CM

## 2023-05-13 ENCOUNTER — Ambulatory Visit: Admitting: Podiatry

## 2023-05-13 ENCOUNTER — Encounter: Payer: Self-pay | Admitting: Podiatry

## 2023-05-13 ENCOUNTER — Ambulatory Visit

## 2023-05-13 DIAGNOSIS — M7751 Other enthesopathy of right foot: Secondary | ICD-10-CM | POA: Diagnosis not present

## 2023-05-13 DIAGNOSIS — M79671 Pain in right foot: Secondary | ICD-10-CM

## 2023-05-13 NOTE — Telephone Encounter (Signed)
 Patient seen today

## 2023-05-13 NOTE — Progress Notes (Signed)
 Subjective: Chief Complaint  Patient presents with   Foot Pain    RM#13 Right foot pain with movement unable to walk without pain states she needs pain management.   53 year old female presents the office with above concerns.  She states the pain that she is having prior to the surgery has returned and feels like her 3rd through 5th toes are ending our prescription.  She does not report any recent injury since I saw her last.  She is not interested in any further surgery.  She like to consider steroid injection to help with pain management.  She is not interested in oral medications at this time.  Objective: AAO x3, NAD DP/PT pulses palpable bilaterally, CRT less than 3 seconds Tenderness mostly on the third MTPJ as well as going laterally.  Unable to appreciate any area pinpoint tenderness.  There is some trace edema but there is no erythema or warmth.  Proximal, extensor tendons appear intact.  No pain with MTPJ range of motion. No pain with calf compression, swelling, warmth, erythema  Assessment: Capsulitis right foot, arthritis third MTPJ  Plan: -All treatment options discussed with the patient including all alternatives, risks, complications.  -X-rays obtained and reviewed.  3 views were obtained.  Hardware intact and prior surgery.  Arthritic changes present to the third MTPJ. -She is not interested in any further surgical intervention.  We discussed different options to help with her pain.  She will try a steroid injection.  I cleaned skin with alcohol and mixture 1 cc Kenalog 10, 0.5 cc Marcaine plain, 0.5 cc of lidocaine plain was infiltrated on the third MTPJ close to the fourth interspace without complication.  Postinjection care discussed.  She tolerated well. -She previously had inserts made and I am going to have her follow-up with [facility] when I am here as well for possible modification of inserts and new inserts.  She states that they were too bulky previously.  Discussed  offloading to help take pressure off the forefoot.  Vivi Barrack DPM

## 2023-05-13 NOTE — Patient Instructions (Signed)

## 2023-06-27 ENCOUNTER — Inpatient Hospital Stay (HOSPITAL_BASED_OUTPATIENT_CLINIC_OR_DEPARTMENT_OTHER): Admission: RE | Admit: 2023-06-27 | Source: Ambulatory Visit | Admitting: Radiology

## 2023-06-27 DIAGNOSIS — Z1231 Encounter for screening mammogram for malignant neoplasm of breast: Secondary | ICD-10-CM

## 2023-07-08 ENCOUNTER — Other Ambulatory Visit

## 2023-08-21 IMAGING — MG MM DIGITAL SCREENING BILAT W/ TOMO AND CAD
8 series · 9 of 24 positions shown · non-contrast
Comparison: Previous exam(s).

CLINICAL DATA: Screening.

EXAM:
DIGITAL SCREENING BILATERAL MAMMOGRAM WITH TOMOSYNTHESIS AND CAD
TECHNIQUE: Bilateral screening digital craniocaudal and mediolateral oblique
mammograms were obtained. Bilateral screening digital breast
tomosynthesis was performed. The images were evaluated with
computer-aided detection.

[R CC synth-2D]
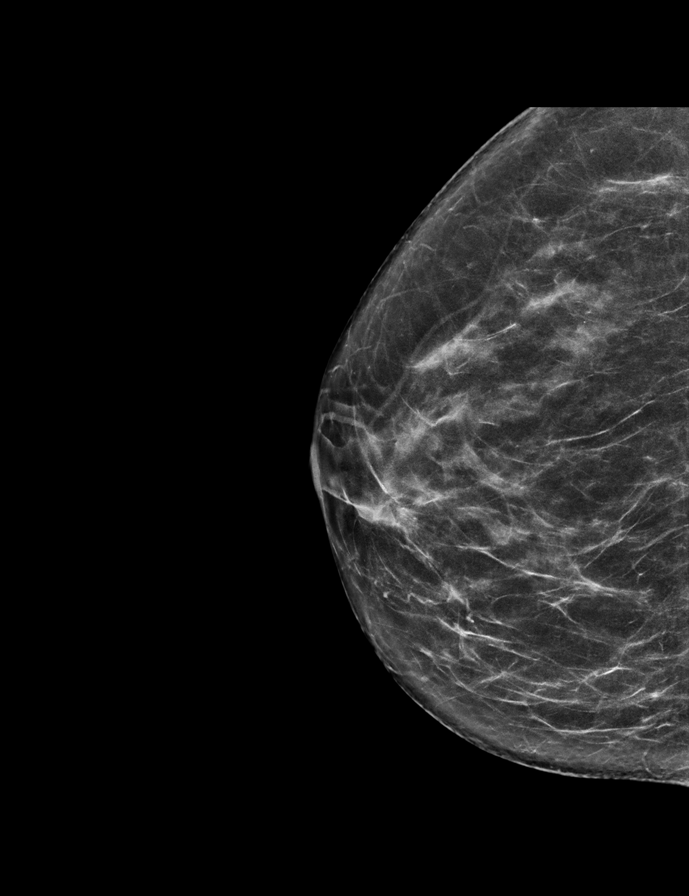

[R MLO synth-2D]
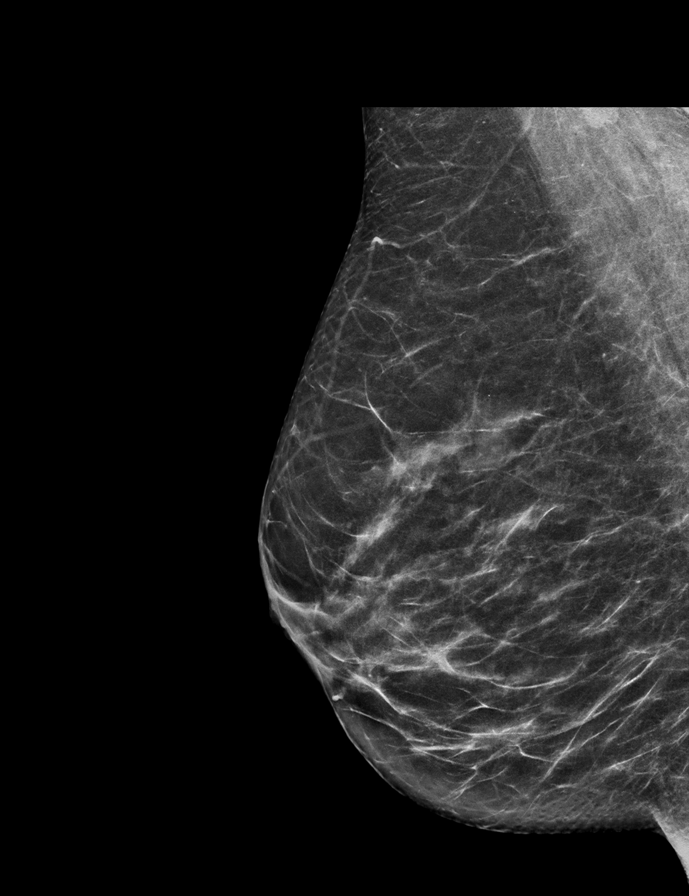

[L MLO synth-2D]
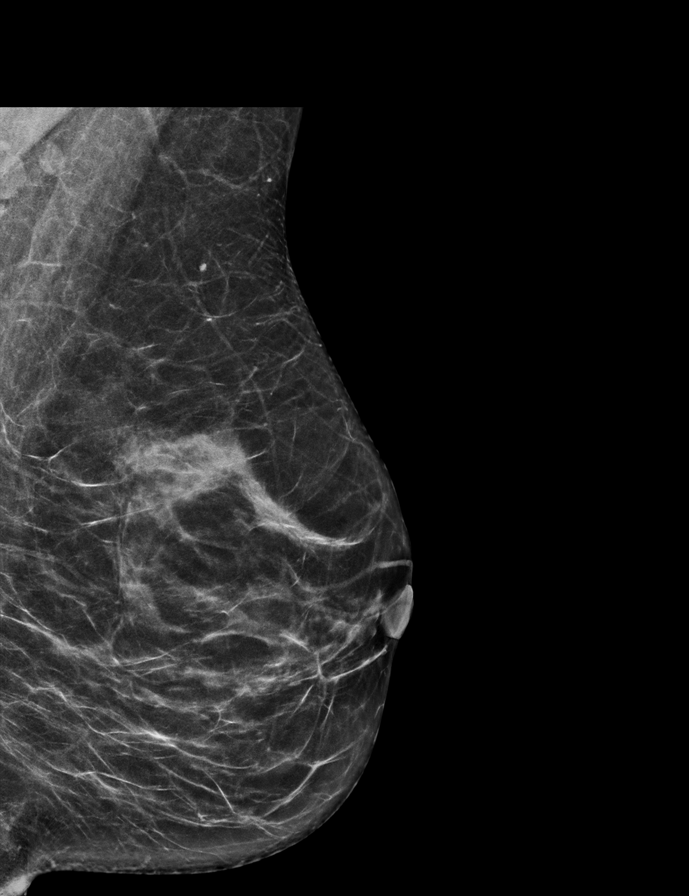

[L CC synth-2D]
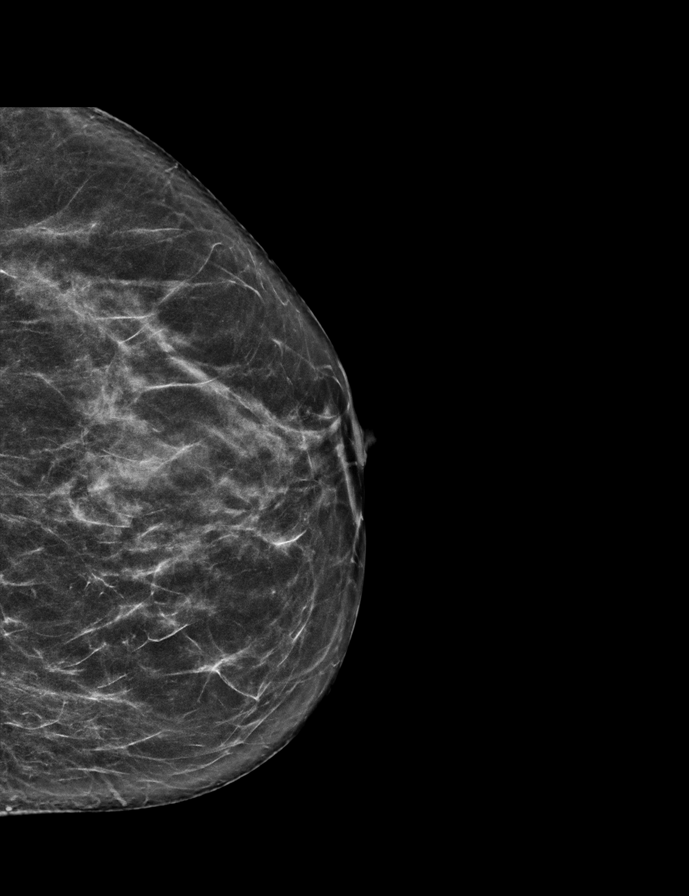

[L MLO tomo · 2 of 63 frames shown]
[frame 21/63]
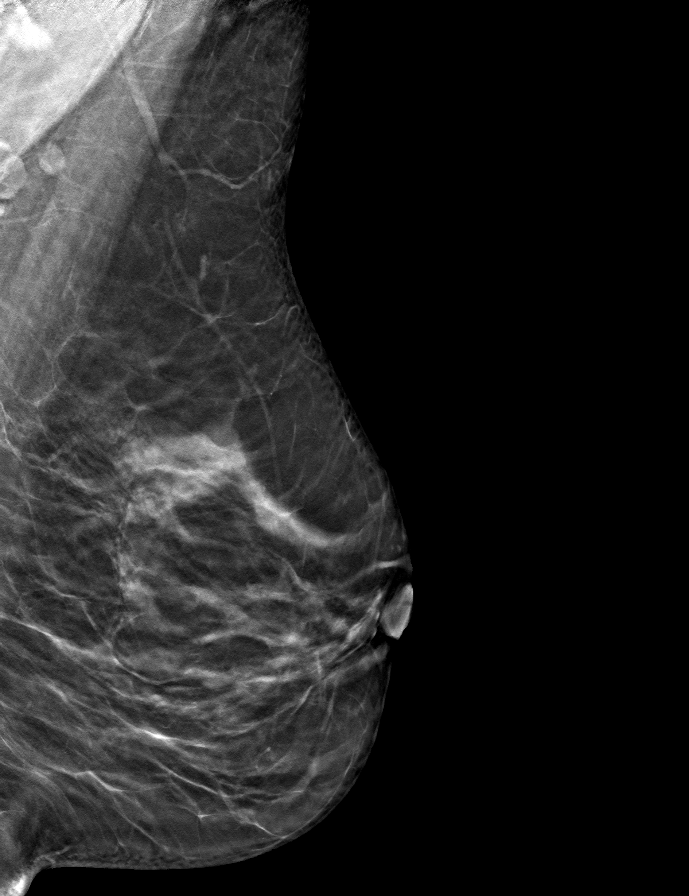
[frame 32/63]
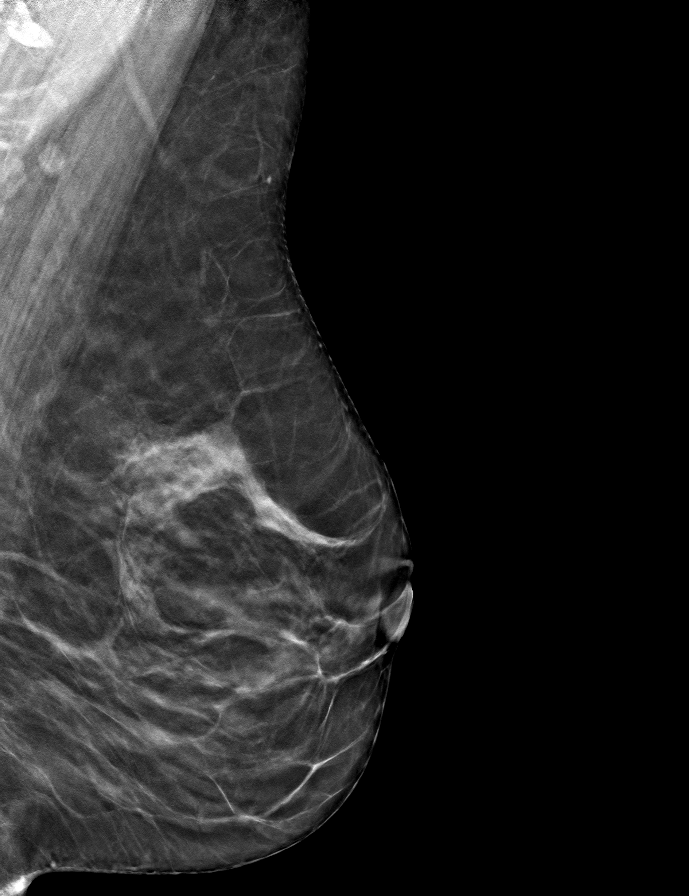

[L CC tomo · tomo slice 30/59.0]
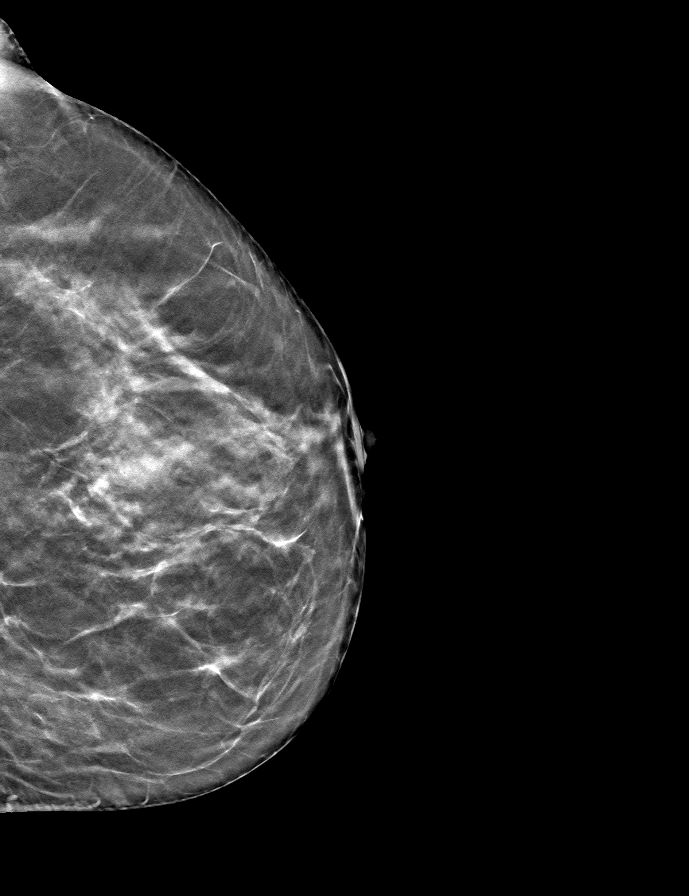

[R MLO tomo · tomo slice 31/62.0]
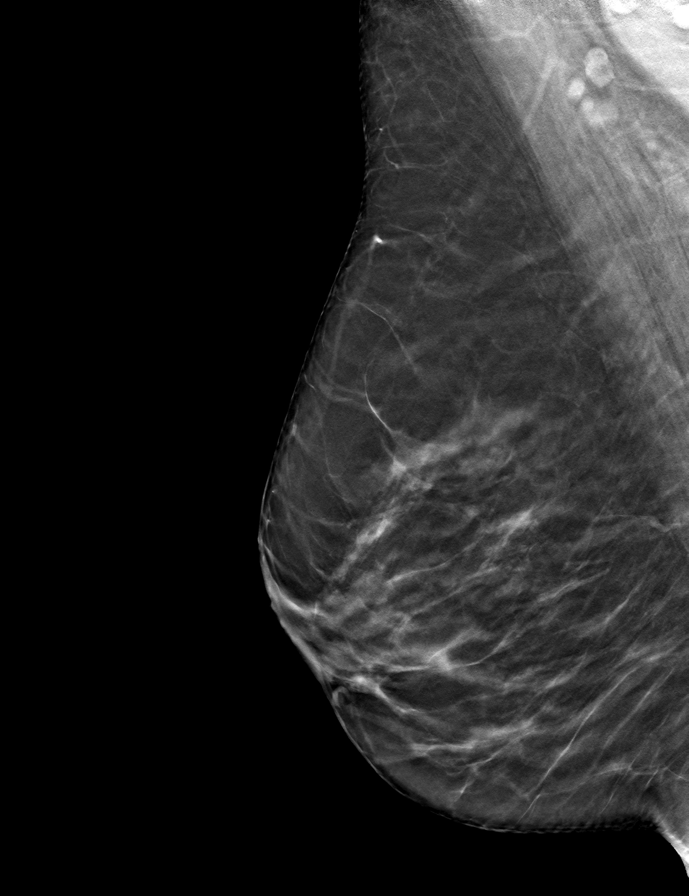

[R CC tomo · tomo slice 33/65.0]
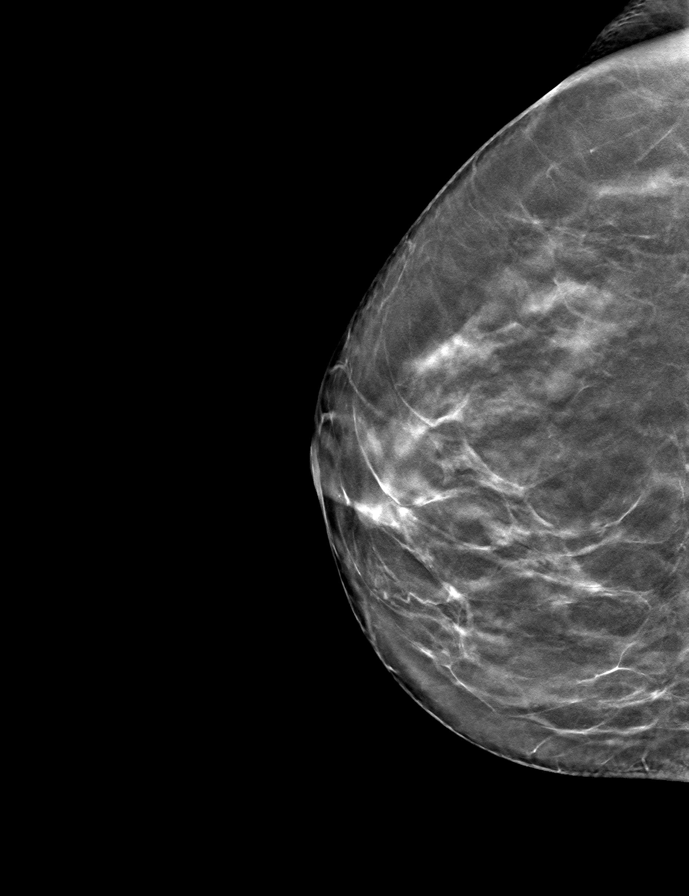

[9 of 24 positions shown; findings below may reference images not displayed]

ACR Breast Density Category b: There are scattered areas of
fibroglandular density.
FINDINGS: There are no findings suspicious for malignancy.
IMPRESSION: No mammographic evidence of malignancy. A result letter of this
screening mammogram will be mailed directly to the patient.

RECOMMENDATION:
Screening mammogram in one year. (Code:51-O-LD2)

BI-RADS CATEGORY  1: Negative.

## 2023-08-25 ENCOUNTER — Ambulatory Visit: Admitting: Radiology

## 2023-08-26 ENCOUNTER — Encounter: Payer: Self-pay | Admitting: Radiology

## 2023-08-26 ENCOUNTER — Ambulatory Visit (INDEPENDENT_AMBULATORY_CARE_PROVIDER_SITE_OTHER): Admitting: Radiology

## 2023-08-26 VITALS — BP 112/70 | Ht 63.25 in | Wt 136.4 lb

## 2023-08-26 DIAGNOSIS — Z01419 Encounter for gynecological examination (general) (routine) without abnormal findings: Secondary | ICD-10-CM | POA: Diagnosis not present

## 2023-08-26 DIAGNOSIS — Z1331 Encounter for screening for depression: Secondary | ICD-10-CM | POA: Diagnosis not present

## 2023-08-26 NOTE — Progress Notes (Signed)
   Sierra Mayo [birth year] [id number]   History: Postmenopausal 53 y.o. presents for annual exam. No new gyn concerns. Had her first grandchild born in February, Ruth.  Gynecologic History Postmenopausal Last Pap: 6/24. Results were: normal Last mammogram: 11/24. Results were: normal Last colonoscopy: 10/23 negative cologuard   Obstetric History OB History  Gravida Para Term Preterm AB Living  2 2    2   SAB IAB Ectopic Multiple Live Births          # Outcome Date GA Lbr Len/2nd Weight Sex Type Anes PTL Lv  2 Para           1 Para                08/26/2023   10:33 AM 10/29/2022    3:11 PM 09/27/2022    1:24 PM  Depression screen PHQ 2/9  Decreased Interest 0 0 0  Down, Depressed, Hopeless 0 0 0  PHQ - 2 Score 0 0 0  Altered sleeping  0   Tired, decreased energy  0   Change in appetite  0   Feeling bad or failure about yourself   0   Trouble concentrating  0   Moving slowly or fidgety/restless  0   Suicidal thoughts  0   PHQ-9 Score  0   Difficult doing work/chores  Not difficult at all      The following portions of the patient's history were reviewed and updated as appropriate: allergies, current medications, past family history, past medical history, past social history, past surgical history, and problem list.  Review of Systems Pertinent items noted in HPI and remainder of comprehensive ROS otherwise negative.  Past medical history, past surgical history, family history and social history were all reviewed and documented in the EPIC chart.  Exam:  Vitals:   08/26/23 1033  BP: 112/70  Weight: 136 lb 6.4 oz (61.9 kg)  Height: 5' 3.25 (1.607 m)   Body mass index is 23.97 kg/m.  General appearance:  Normal Thyroid:  Symmetrical, normal in size, without palpable masses or nodularity. Respiratory  Auscultation:  Clear without wheezing or rhonchi Cardiovascular  Auscultation:  Regular rate, without rubs, murmurs or gallops  Edema/varicosities:  Not  grossly evident Abdominal  Soft,nontender, without masses, guarding or rebound.  Liver/spleen:  No organomegaly noted  Hernia:  None appreciated  Skin  Inspection:  Grossly normal Breasts: Examined lying and sitting.   Right: Without masses, retractions, nipple discharge or axillary adenopathy.   Left: Without masses, retractions, nipple discharge or axillary adenopathy. Genitourinary   Inguinal/mons:  Normal without inguinal adenopathy  External genitalia:  Normal appearing vulva with no masses, tenderness, or lesions  BUS/Urethra/Skene's glands:  Normal  Vagina:  Normal appearing with normal color and discharge, no lesions. Atrophy: moderate   Cervix:  Normal appearing without discharge or lesions  Uterus:  Normal in size, shape and contour.  Midline and mobile, nontender  Adnexa/parametria:     Rt: Normal in size, without masses or tenderness.   Lt: Normal in size, without masses or tenderness.  Anus and perineum: Normal    Darice Hoit, CMA present for exam  Assessment/Plan:   1. Well woman exam with routine gynecological exam (Primary) Pap 2027 Stopped imvexxy  due to cost, declines another option at this time  2. Depression screening negative    Return in 1 year for annual or sooner prn.  Giovonnie Trettel B WHNP-BC, 11:10 AM 08/26/2023

## 2023-11-01 ENCOUNTER — Encounter: Payer: Self-pay | Admitting: Family Medicine

## 2023-11-01 ENCOUNTER — Ambulatory Visit: Admitting: Family Medicine

## 2023-11-01 VITALS — BP 120/79 | HR 68 | Temp 98.1°F | Resp 16 | Ht 63.25 in | Wt 136.2 lb

## 2023-11-01 DIAGNOSIS — G43109 Migraine with aura, not intractable, without status migrainosus: Secondary | ICD-10-CM

## 2023-11-01 DIAGNOSIS — M25562 Pain in left knee: Secondary | ICD-10-CM | POA: Diagnosis not present

## 2023-11-01 DIAGNOSIS — R7303 Prediabetes: Secondary | ICD-10-CM | POA: Diagnosis not present

## 2023-11-01 DIAGNOSIS — F419 Anxiety disorder, unspecified: Secondary | ICD-10-CM

## 2023-11-01 DIAGNOSIS — G8929 Other chronic pain: Secondary | ICD-10-CM

## 2023-11-01 LAB — POCT GLYCOSYLATED HEMOGLOBIN (HGB A1C): Hemoglobin A1C: 5.6 % (ref 4.0–5.6)

## 2023-11-01 MED ORDER — CLONAZEPAM 0.5 MG PO TABS: 0.5000 mg | ORAL_TABLET | Freq: Two times a day (BID) | ORAL | 3 refills | Status: AC

## 2023-11-01 NOTE — Progress Notes (Signed)
 Subjective:     Patient ID: Sierra Mayo, female    DOB: November 13, 1970, 53 y.o.   MRN: [id number]  Chief Complaint  Patient presents with   Medical Management of Chronic Issues    6 month follow-up Inside of left knee pain    HPI Pre,anx,mig Discussed the use of AI scribe software for clinical note transcription with the patient, who gave verbal consent to proceed.  History of Present Illness Sierra Mayo is a 53 year old female with prediabetes, anxiety, and migraines who presents for follow-up.  She recalls being told her A1c was 5.7 and that she was considered 'borderline' for prediabetes. She is concerned about her genetic predisposition due to her father's diabetes. She maintains a diet low in sugar and carbohydrates, although she enjoys bread. She exercises regularly, averaging about 12,000 steps a day and attending yoga classes three times a week.  Her migraines have significantly reduced in frequency, now occurring about once a month. She attributes this improvement to taking loratadine daily. She uses Imitrex  sporadically, with about eight pills remaining from her last prescription.  For anxiety, she takes clonazepam  0.5 mg once daily, occasionally taking a second dose at night if needed, particularly when stressed by her son. She has been taking it once daily for the past 45 days, having reduced from twice daily. No thoughts of suicide.  She recounts a fall a couple of months ago where she tripped over an unmarked curb, impacting her left side but sustaining no significant injuries.  Medial  knee pain intermitt-long time.    Health Maintenance Due  Topic Date Due   Hepatitis B Vaccines 19-59 Average Risk (1 of 3 - 19+ 3-dose series) Never done    Past Medical History:  Diagnosis Date   Allergy    Anxiety 1994   Depression 1994   Migraine    w/and w/o    Past Surgical History:  Procedure Laterality Date   ABLATION ON ENDOMETRIOSIS N/A 2013   uterine    BUNIONECTOMY Right 04/28/2022   and other toes   CARDIAC ELECTROPHYSIOLOGY MAPPING AND ABLATION N/A 2017   SVT   TOE SURGERY Right    3rd toe x2 04/2022   TUBAL LIGATION  2006     Current Outpatient Medications:    B Complex Vitamins (B COMPLEX PO), Take by mouth., Disp: , Rfl:    CITRUS BERGAMOT PO, Take by mouth., Disp: , Rfl:    Cyanocobalamin (B-12 PO), Take by mouth., Disp: , Rfl:    MAGNESIUM GLYCINATE PO, Take by mouth., Disp: , Rfl:    meclizine (ANTIVERT) 25 MG tablet, Take one tab every 8hrs as needed for vertigo, Disp: , Rfl:    Multiple Vitamins-Minerals (YOUR LIFE MULTI ADULT GUMMIES) CHEW, , Disp: , Rfl:    Omega-3 Fatty Acids (FISH OIL) 1000 MG CAPS, Take 1,000 mg by mouth daily in the afternoon., Disp: , Rfl:    SUMAtriptan  (IMITREX ) 50 MG tablet, TAKE 1 TABLET BY MOUTH AT ONSET OF HEADACHE. MAY REPEAT IN 2 HOURS IF UNRESOLVED - DO NOT EXCEED 200 MG IN 24 HOURS, Disp: 9 tablet, Rfl: 1   VITAMIN D, CHOLECALCIFEROL, PO, Take by mouth., Disp: , Rfl:    Vitamin E 45 MG (100 UNIT) CAPS, Take 100 Units by mouth., Disp: , Rfl:    clonazePAM  (KLONOPIN ) 0.5 MG tablet, Take 1 tablet (0.5 mg total) by mouth 2 (two) times daily., Disp: 60 tablet, Rfl: 3  Allergies  Allergen Reactions  Prochlorperazine Anaphylaxis and Palpitations    Other reaction(s): GI Intolerance, GI upset, Other (See Comments) Other reaction(s): spasms dystonia    Amitriptyline     Other reaction(s): AMITRIPTYLINE (extreme fatigue after one dose)   Amitriptyline Hcl     Other reaction(s): AMITRIPTYLINE (extreme fatigue after one dose)   Erythromycin Diarrhea    Other reaction(s): GI Intolerance, GI upset   Propranolol     Other reaction(s): Dizzy / Lightheaded Other reaction(s): Dizzy / Lightheaded    ROS neg/noncontributory except as noted HPI/below      Objective:     BP 120/79   Pulse 68   Temp 98.1 F (36.7 C) (Temporal)   Resp 16   Ht 5' 3.25 (1.607 m)   Wt 136 lb 4 oz (61.8 kg)    SpO2 98%   BMI 23.95 kg/m  Wt Readings from Last 3 Encounters:  11/01/23 136 lb 4 oz (61.8 kg)  08/26/23 136 lb 6.4 oz (61.9 kg)  04/29/23 139 lb (63 kg)    Physical Exam   Gen: WDWN NAD HEENT: NCAT, conjunctiva not injected, sclera nonicteric NECK:  supple, no thyromegaly, no nodes, no carotid bruits CARDIAC: RRR, S1S2+, no murmur. DP 2+B LUNGS: CTAB. No wheezes ABDOMEN:  BS+, soft, NTND, No HSM, no masses EXT:  no edema MSK: no gross abnormalities.   L knee, some TTP medially NEURO: A&O x3.  CN II-XII intact.  PSYCH: normal mood. Good eye contact  Results for orders placed or performed in visit on 11/01/23  POCT HgB A1C   Collection Time: 11/01/23  3:56 PM  Result Value Ref Range   Hemoglobin A1C 5.6 4.0 - 5.6 %        Assessment & Plan:  Migraine with aura and without status migrainosus, not intractable  Anxiety -     clonazePAM ; Take 1 tablet (0.5 mg total) by mouth 2 (two) times daily.  Dispense: 60 tablet; Refill: 3  Prediabetes  Chronic pain of left knee  Assessment and Plan Assessment & Plan Migraine and tension-type headache   She experiences migraines about once a month, effectively managed with Imitrex . Environmental triggers have been identified, and daily loratadine has reduced her frequency. Tension headaches occur occasionally and are managed with hydration, breathing exercises, and stretching. Imitrex  is effective for both types of headaches when needed. Do not refill Imitrex  at this time as she has a sufficient supply.  Anxiety disorder   Her anxiety is managed with clonazepam  0.5 mg once daily, occasionally increased to twice daily if needed. Recently, the dosage was reduced to once daily. She reports no suicidal ideation. Send clonazepam  prescription to [facility] with instructions to avoid automatic refills. Pdmp checked  Prediabetes   Her A1c was 5.7, indicating prediabetes. With a family history of diabetes, she maintains a good diet and  exercise routine.   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  Worse, consider ortho    Return in about 6 months (around 04/30/2024) for annual physical.  Jenkins CHRISTELLA Carrel, MD

## 2023-11-01 NOTE — Patient Instructions (Addendum)
 It was very nice to see you today!  [employer] PT exercises for knees.  Glucosamine/tumeric.    PLEASE NOTE:  If you had any lab tests please let us  know if you have not heard back within a few days. You may see your results on MyChart before we have a chance to review them but we will give you a call once they are reviewed by us . If we ordered any referrals today, please let us  know if you have not heard from their office within the next week.   Please try these tips to maintain a healthy lifestyle:  Eat most of your calories during the day when you are active. Eliminate processed foods including packaged sweets (pies, cakes, cookies), reduce intake of potatoes, white bread, white pasta, and white rice. Look for whole grain options, oat flour or almond flour.  Each meal should contain half fruits/vegetables, one quarter protein, and one quarter carbs (no bigger than a computer mouse).  Cut down on sweet beverages. This includes juice, soda, and sweet tea. Also watch fruit intake, though this is a healthier sweet option, it still contains natural sugar! Limit to 3 servings daily.  Drink at least 1 glass of water with each meal and aim for at least 8 glasses per day  Exercise at least 150 minutes every week.

## 2023-11-22 ENCOUNTER — Ambulatory Visit

## 2023-11-22 DIAGNOSIS — L603 Nail dystrophy: Secondary | ICD-10-CM

## 2023-11-22 NOTE — Patient Instructions (Signed)

## 2023-11-22 NOTE — Progress Notes (Signed)
 Subjective:  Patient ID: Sierra Mayo, female    DOB: 1970-11-02,  MRN: [id number]  Chief Complaint  Patient presents with   Nail Problem    Patient is here for left 3rd nail layered/crack- total nail avulsion    53 y.o. female presents with the above complaint.  She states that her left third nail has detach to the proximal lateral aspect of it.  She is curious if there is a Mayo nail growing underneath.  She states that she did bump it minorly but has not had any type of issues with the toe and does not have any continued pain.  She is concerned about how the nail will grow out.   Review of Systems: Negative except as noted in the HPI. Denies N/V/F/Ch.  Past Medical History:  Diagnosis Date   Allergy    Anxiety 1994   Depression 1994   Migraine    w/and w/o    Current Outpatient Medications:    B Complex Vitamins (B COMPLEX PO), Take by mouth., Disp: , Rfl:    CITRUS BERGAMOT PO, Take by mouth., Disp: , Rfl:    clonazePAM  (KLONOPIN ) 0.5 MG tablet, Take 1 tablet (0.5 mg total) by mouth 2 (two) times daily., Disp: 60 tablet, Rfl: 3   Cyanocobalamin (B-12 PO), Take by mouth., Disp: , Rfl:    MAGNESIUM GLYCINATE PO, Take by mouth., Disp: , Rfl:    meclizine (ANTIVERT) 25 MG tablet, Take one tab every 8hrs as needed for vertigo, Disp: , Rfl:    Multiple Vitamins-Minerals (YOUR LIFE MULTI ADULT GUMMIES) CHEW, , Disp: , Rfl:    Omega-3 Fatty Acids (FISH OIL) 1000 MG CAPS, Take 1,000 mg by mouth daily in the afternoon., Disp: , Rfl:    SUMAtriptan  (IMITREX ) 50 MG tablet, TAKE 1 TABLET BY MOUTH AT ONSET OF HEADACHE. MAY REPEAT IN 2 HOURS IF UNRESOLVED - DO NOT EXCEED 200 MG IN 24 HOURS, Disp: 9 tablet, Rfl: 1   VITAMIN D, CHOLECALCIFEROL, PO, Take by mouth., Disp: , Rfl:    Vitamin E 45 MG (100 UNIT) CAPS, Take 100 Units by mouth., Disp: , Rfl:   Social History   Tobacco Use  Smoking Status Former   Current packs/day: 0.00   Average packs/day: 1 pack/day for 15.0 years (15.0 ttl  pk-yrs)   Types: Cigarettes   Start date: 10/29/1986   Quit date: 10/28/2001   Years since quitting: 22.0  Smokeless Tobacco Never    Allergies  Allergen Reactions   Prochlorperazine Anaphylaxis and Palpitations    Other reaction(s): GI Intolerance, GI upset, Other (See Comments) Other reaction(s): spasms dystonia    Amitriptyline     Other reaction(s): AMITRIPTYLINE (extreme fatigue after one dose)   Amitriptyline Hcl     Other reaction(s): AMITRIPTYLINE (extreme fatigue after one dose)   Erythromycin Diarrhea    Other reaction(s): GI Intolerance, GI upset   Propranolol     Other reaction(s): Dizzy / Lightheaded Other reaction(s): Dizzy / Lightheaded    Objective:  There were no vitals filed for this visit. There is no height or weight on file to calculate BMI. Constitutional Well developed. Well nourished.  Vascular Dorsalis pedis pulses palpable bilaterally. Posterior tibial pulses palpable bilaterally. Capillary refill normal to all digits.  No cyanosis or clubbing noted. Pedal hair growth normal.  Neurologic Normal speech. Oriented to person, place, and time. Epicritic sensation to light touch grossly present bilaterally.  Dermatologic Left third toenail exhibits loss of adherence at the proximal lateral  aspect.  The distal aspect is still well adhered to the underlying nailbed.  There is some lifting of the proximal aspect.  No erythema, drainage, edema, signs of infection. No other open wounds. No skin lesions.  Orthopedic: Normal joint ROM without pain or crepitus bilaterally. No visible deformities. No bony tenderness.   Radiographs: None Assessment:   1. Nail dystrophy [L60.3]    Plan:  Patient was evaluated and treated and all questions answered.  Nail dystrophy, left third,  -Discussed treatment options ranging from surgical to conservative along with risks and benefits of each. Patient expresses understanding. Patient elects to proceed with minor  surgery to remove dystrophic toenail today. Consent reviewed and signed by patient. -Ingrown nail excised. See procedure note. -Educated on post-procedure care including soaking. Written instructions provided and reviewed. -Patient to follow up in 2 weeks for nail check.  Procedure: Excision of dystrophic Toenail Location: Left 3rd toe whole nail. Anesthesia: Lidocaine 1% plain; 1.5 mL and Marcaine 0.5% plain; 1.5 mL, digital block. Skin Prep: Betadine. Dressing: Silvadene; telfa; dry, sterile, compression dressing. Technique: Following skin prep, the toe was exsanguinated and a tourniquet was secured at the base of the toe. The affected nail was freed, and excised. The area was flushed with alcohol. The tourniquet was then removed and sterile dressing applied. Disposition: Patient tolerated procedure well. Patient to return in 2 weeks for follow-up.  Post op care: Keep dressing clean, dry, and intact for 24 hours before removing. Soak operative foot and redress BID as printed instructions describe. Patient educated on signs of infection, pt expresses understanding and will proceed for prompt medical care if signs of infection arise.   Return in about 2 weeks (around 12/06/2023).  Prentice Ovens, DPM AACFAS Fellowship Trained Podiatric Surgeon [facility]

## 2023-12-06 ENCOUNTER — Ambulatory Visit

## 2024-03-10 ENCOUNTER — Encounter (HOSPITAL_BASED_OUTPATIENT_CLINIC_OR_DEPARTMENT_OTHER): Admitting: Radiology

## 2024-03-10 DIAGNOSIS — Z1231 Encounter for screening mammogram for malignant neoplasm of breast: Secondary | ICD-10-CM

## 2024-04-30 ENCOUNTER — Encounter: Admitting: Family Medicine
